# Patient Record
Sex: Female | Born: 1960 | Race: White | Hispanic: No | Marital: Married | Smoking: Current some day smoker
Health system: Southern US, Community
[De-identification: ages and names within clinical notes are randomized; demographics above are authoritative.]

## PROBLEM LIST (undated history)

## (undated) HISTORY — PX: BACK SURGERY: SHX140

## (undated) HISTORY — PX: PARTIAL HYSTERECTOMY: SHX80

---

## 2004-05-21 ENCOUNTER — Observation Stay (HOSPITAL_COMMUNITY): Admission: RE | Admit: 2004-05-21 | Discharge: 2004-05-22 | Payer: Self-pay | Admitting: *Deleted

## 2006-05-25 ENCOUNTER — Ambulatory Visit: Payer: Self-pay | Admitting: Internal Medicine

## 2014-07-20 ENCOUNTER — Emergency Department (HOSPITAL_BASED_OUTPATIENT_CLINIC_OR_DEPARTMENT_OTHER)
Admission: EM | Admit: 2014-07-20 | Discharge: 2014-07-20 | Disposition: A | Discharge status: Home / Self Care | Payer: 59 | Attending: Emergency Medicine | Admitting: Emergency Medicine

## 2014-07-20 ENCOUNTER — Encounter (HOSPITAL_BASED_OUTPATIENT_CLINIC_OR_DEPARTMENT_OTHER): Payer: Self-pay | Admitting: *Deleted

## 2014-07-20 DIAGNOSIS — S61312A Laceration without foreign body of right middle finger with damage to nail, initial encounter: Secondary | ICD-10-CM

## 2014-07-20 DIAGNOSIS — S61212A Laceration without foreign body of right middle finger without damage to nail, initial encounter: Secondary | ICD-10-CM | POA: Insufficient documentation

## 2014-07-20 DIAGNOSIS — Z23 Encounter for immunization: Secondary | ICD-10-CM | POA: Diagnosis not present

## 2014-07-20 DIAGNOSIS — Y9289 Other specified places as the place of occurrence of the external cause: Secondary | ICD-10-CM | POA: Insufficient documentation

## 2014-07-20 DIAGNOSIS — S6991XA Unspecified injury of right wrist, hand and finger(s), initial encounter: Secondary | ICD-10-CM | POA: Diagnosis present

## 2014-07-20 DIAGNOSIS — Z720 Tobacco use: Secondary | ICD-10-CM | POA: Insufficient documentation

## 2014-07-20 DIAGNOSIS — Y9389 Activity, other specified: Secondary | ICD-10-CM | POA: Diagnosis not present

## 2014-07-20 DIAGNOSIS — Y998 Other external cause status: Secondary | ICD-10-CM | POA: Insufficient documentation

## 2014-07-20 DIAGNOSIS — W290XXA Contact with powered kitchen appliance, initial encounter: Secondary | ICD-10-CM | POA: Diagnosis not present

## 2014-07-20 MED ORDER — TETANUS-DIPHTH-ACELL PERTUSSIS 5-2.5-18.5 LF-MCG/0.5 IM SUSP
0.5000 mL | Freq: Once | INTRAMUSCULAR | Status: AC
  Administered 2014-07-20: 0.5 mL via INTRAMUSCULAR
  Filled 2014-07-20: qty 0.5

## 2014-07-20 MED ORDER — LIDOCAINE HCL (PF) 1 % IJ SOLN
10.0000 mL | Freq: Once | INTRAMUSCULAR | Status: AC
  Administered 2014-07-20: 10 mL
  Filled 2014-07-20: qty 10

## 2014-07-20 NOTE — ED Notes (Signed)
Pt states that approx one hour ago she was getting something out of her cabinet and a blade from the food processor fell and she attempted to catch it causing a laceration to her middle finger. Bleeding controlled. Last Td unknown. approx one inch laceration noted to the base of her middle finger.

## 2014-07-20 NOTE — ED Provider Notes (Signed)
CSN: [id number]     Arrival date & time 07/20/14  2022 History   First MD Initiated Contact with Patient 07/20/14 2036     Chief Complaint  Patient presents with  . Finger Injury     (Consider location/radiation/quality/duration/timing/severity/associated sxs/prior Treatment) HPI Comments: 54 year old female presenting with a laceration to her right middle finger occurring 1 hour PTA when she was trying to catch the food processor blade that fell out of the cabinet. No aggravating or alleviating factors. She was able to control the bleeding with pressure. Denies any significant pain. States her finger is slightly numb. Unknown last tetanus.  Patient is a 54 y.o. female presenting with skin laceration. The history is provided by the patient.  Laceration Location:  Finger Finger laceration location:  R middle finger Length (cm):  2 Depth:  Through underlying tissue Bleeding: controlled   Time since incident:  1 hour Injury mechanism: food processor blade. Foreign body present:  No foreign bodies Relieved by:  None tried Tetanus status:  Unknown   History reviewed. No pertinent past medical history. Past Surgical History  Procedure Laterality Date  . Back surgery    . Partial hysterectomy     No family history on file. History  Substance Use Topics  . Smoking status: Current Some Day Smoker  . Smokeless tobacco: Not on file  . Alcohol Use: No   OB History    No data available     Review of Systems  Constitutional: Negative.   Musculoskeletal: Negative for joint swelling.  Skin: Positive for wound.  Neurological: Positive for numbness.      Allergies  Codeine  Home Medications   Prior to Admission medications   Not on File   BP 125/68 mmHg  Pulse 86  Temp(Src) 98.3 F (36.8 C)  Resp 18  Ht  (1.626 m)  Wt 142 lb (64.411 kg)  BMI 24.36 kg/m2  SpO2 99% Physical Exam  Constitutional: She is oriented to person, place, and time. She appears  well-developed and well-nourished. No distress.  HENT:  Head: Normocephalic and atraumatic.  Mouth/Throat: Oropharynx is clear and moist.  Eyes: Conjunctivae and EOM are normal.  Neck: Normal range of motion. Neck supple.  Cardiovascular: Normal rate, regular rhythm and normal heart sounds.   Pulmonary/Chest: Effort normal and breath sounds normal. No respiratory distress.  Musculoskeletal: Normal range of motion. She exhibits no edema.       Hands: 2 cm laceration to palmar aspect of proximal phalanx of right middle finger. No visible bone or tendon. Laceration through subcutaneous tissue. No active bleeding. Full flexion and extension at MCP, PIP and DIP. Cap refill less than 2 seconds. Sensation intact.  Neurological: She is alert and oriented to person, place, and time. No sensory deficit.  Skin: Skin is warm and dry.  Psychiatric: She has a normal mood and affect. Her behavior is normal.  Nursing note and vitals reviewed.   ED Course  Procedures (including critical care time) LACERATION REPAIR Performed by: Celene Skeen Authorized by: Celene Skeen Consent: Verbal consent obtained. Risks and benefits: risks, benefits and alternatives were discussed Consent given by: patient Patient identity confirmed: provided demographic data Prepped and Draped in normal sterile fashion Wound explored  Laceration Location: right middle finger  Laceration Length: 2 cm  No Foreign Bodies seen or palpated  Anesthesia: local infiltration  Local anesthetic: lidocaine 1% without epinephrine  Anesthetic total: 2 ml  Irrigation method: syringe Amount of cleaning: standard  Skin closure: 5-0  prolene  Number of sutures: simple interrupted  Technique: 4  Patient tolerance: Patient tolerated the procedure well with no immediate complications.  Labs Review Labs Reviewed - No data to display  Imaging Review No results found.   EKG Interpretation None      MDM   Final diagnoses:    Laceration of right middle finger w/o foreign body with damage to nail, initial encounter   Neurovascularly intact. No evidence of tendon disruption. Wound care given. Tetanus updated. Laceration repaired. Stable for discharge. Return precautions given. Patient states understanding of treatment care plan and is agreeable.  Kathrynn SpeedRobyn M Dave Mergen, PA-C 07/20/14 2117  Gilda Creasehristopher J Pollina, MD 07/21/14 [id number]

## 2014-07-20 NOTE — Discharge Instructions (Signed)
Laceration Care, Adult °A laceration is a cut or lesion that goes through all layers of the skin and into the tissue just beneath the skin. °TREATMENT  °Some lacerations may not require closure. Some lacerations may not be able to be closed due to an increased risk of infection. It is important to see your caregiver as soon as possible after an injury to minimize the risk of infection and maximize the opportunity for successful closure. °If closure is appropriate, pain medicines may be given, if needed. The wound will be cleaned to help prevent infection. Your caregiver will use stitches (sutures), staples, wound glue (adhesive), or skin adhesive strips to repair the laceration. These tools bring the skin edges together to allow for faster healing and a better cosmetic outcome. However, all wounds will heal with a scar. Once the wound has healed, scarring can be minimized by covering the wound with sunscreen during the day for 1 full year. °HOME CARE INSTRUCTIONS  °For sutures or staples: °· Keep the wound clean and dry. °· If you were given a bandage (dressing), you should change it at least once a day. Also, change the dressing if it becomes wet or dirty, or as directed by your caregiver. °· Wash the wound with soap and water 2 times a day. Rinse the wound off with water to remove all soap. Pat the wound dry with a clean towel. °· After cleaning, apply a thin layer of the antibiotic ointment as recommended by your caregiver. This will help prevent infection and keep the dressing from sticking. °· You may shower as usual after the first 24 hours. Do not soak the wound in water until the sutures are removed. °· Only take over-the-counter or prescription medicines for pain, discomfort, or fever as directed by your caregiver. °· Get your sutures or staples removed as directed by your caregiver. °For skin adhesive strips: °· Keep the wound clean and dry. °· Do not get the skin adhesive strips wet. You may bathe  carefully, using caution to keep the wound dry. °· If the wound gets wet, pat it dry with a clean towel. °· Skin adhesive strips will fall off on their own. You may trim the strips as the wound heals. Do not remove skin adhesive strips that are still stuck to the wound. They will fall off in time. °For wound adhesive: °· You may briefly wet your wound in the shower or bath. Do not soak or scrub the wound. Do not swim. Avoid periods of heavy perspiration until the skin adhesive has fallen off on its own. After showering or bathing, gently pat the wound dry with a clean towel. °· Do not apply liquid medicine, cream medicine, or ointment medicine to your wound while the skin adhesive is in place. This may loosen the film before your wound is healed. °· If a dressing is placed over the wound, be careful not to apply tape directly over the skin adhesive. This may cause the adhesive to be pulled off before the wound is healed. °· Avoid prolonged exposure to sunlight or tanning lamps while the skin adhesive is in place. Exposure to ultraviolet light in the first year will darken the scar. °· The skin adhesive will usually remain in place for 5 to 10 days, then naturally fall off the skin. Do not pick at the adhesive film. °You may need a tetanus shot if: °· You cannot remember when you had your last tetanus shot. °· You have never had a tetanus   shot. °If you get a tetanus shot, your arm may swell, get red, and feel warm to the touch. This is common and not a problem. If you need a tetanus shot and you choose not to have one, there is a rare chance of getting tetanus. Sickness from tetanus can be serious. °SEEK MEDICAL CARE IF:  °· You have redness, swelling, or increasing pain in the wound. °· You see a red line that goes away from the wound. °· You have yellowish-white fluid (pus) coming from the wound. °· You have a fever. °· You notice a bad smell coming from the wound or dressing. °· Your wound breaks open before or  after sutures have been removed. °· You notice something coming out of the wound such as wood or glass. °· Your wound is on your hand or foot and you cannot move a finger or toe. °SEEK IMMEDIATE MEDICAL CARE IF:  °· Your pain is not controlled with prescribed medicine. °· You have severe swelling around the wound causing pain and numbness or a change in color in your arm, hand, leg, or foot. °· Your wound splits open and starts bleeding. °· You have worsening numbness, weakness, or loss of function of any joint around or beyond the wound. °· You develop painful lumps near the wound or on the skin anywhere on your body. °MAKE SURE YOU:  °· Understand these instructions. °· Will watch your condition. °· Will get help right away if you are not doing well or get worse. °Document Released: 02/08/2005 Document Revised: 05/03/2011 Document Reviewed: 08/04/2010 °[employer]® Patient Information ©2015 [employer]. This information is not intended to replace advice given to you by your health care provider. Make sure you discuss any questions you have with your health care provider. ° °Sutured Wound Care °Sutures are stitches that can be used to close wounds. Wound care helps prevent pain and infection.  °HOME CARE INSTRUCTIONS  °· Rest and elevate the injured area until all the pain and swelling are gone. °· Only take over-the-counter or prescription medicines for pain, discomfort, or fever as directed by your caregiver. °· After 48 hours, gently wash the area with mild soap and water once a day, or as directed. Rinse off the soap. Pat the area dry with a clean towel. Do not rub the wound. This may cause bleeding. °· Follow your caregiver's instructions for how often to change the bandage (dressing). Stop using a dressing after 2 days or after the wound stops draining. °· If the dressing sticks, moisten it with soapy water and gently remove it. °· Apply ointment on the wound as directed. °· Avoid stretching a sutured  wound. °· Drink enough fluids to keep your urine clear or pale yellow. °· Follow up with your caregiver for suture removal as directed. °· Use sunscreen on your wound for the next 3 to 6 months so the scar will not darken. °SEEK IMMEDIATE MEDICAL CARE IF:  °· Your wound becomes red, swollen, hot, or tender. °· You have increasing pain in the wound. °· You have a red streak that extends from the wound. °· There is pus coming from the wound. °· You have a fever. °· You have shaking chills. °· There is a bad smell coming from the wound. °· You have persistent bleeding from the wound. °MAKE SURE YOU:  °· Understand these instructions. °· Will watch your condition. °· Will get help right away if you are not doing well or get worse. °Document Released:   03/18/2004 Document Revised: 05/03/2011 Document Reviewed: 06/14/2010 °[employer]® Patient Information ©2015 [employer]. This information is not intended to replace advice given to you by your health care provider. Make sure you discuss any questions you have with your health care provider. ° °

## 2018-10-31 ENCOUNTER — Emergency Department (HOSPITAL_COMMUNITY): Payer: BLUE CROSS/BLUE SHIELD

## 2018-10-31 ENCOUNTER — Other Ambulatory Visit: Payer: Self-pay

## 2018-10-31 ENCOUNTER — Inpatient Hospital Stay (HOSPITAL_COMMUNITY)
Admission: EM | Admit: 2018-10-31 | Discharge: 2018-11-03 | DRG: 064 | Disposition: A | Discharge status: Home / Self Care | Payer: BLUE CROSS/BLUE SHIELD | Attending: Neurosurgery | Admitting: Neurosurgery

## 2018-10-31 DIAGNOSIS — Z885 Allergy status to narcotic agent status: Secondary | ICD-10-CM

## 2018-10-31 DIAGNOSIS — Z90711 Acquired absence of uterus with remaining cervical stump: Secondary | ICD-10-CM | POA: Diagnosis not present

## 2018-10-31 DIAGNOSIS — Z23 Encounter for immunization: Secondary | ICD-10-CM

## 2018-10-31 DIAGNOSIS — Z20828 Contact with and (suspected) exposure to other viral communicable diseases: Secondary | ICD-10-CM | POA: Diagnosis present

## 2018-10-31 DIAGNOSIS — F17210 Nicotine dependence, cigarettes, uncomplicated: Secondary | ICD-10-CM | POA: Diagnosis present

## 2018-10-31 DIAGNOSIS — I614 Nontraumatic intracerebral hemorrhage in cerebellum: Principal | ICD-10-CM

## 2018-10-31 DIAGNOSIS — R739 Hyperglycemia, unspecified: Secondary | ICD-10-CM | POA: Diagnosis present

## 2018-10-31 DIAGNOSIS — I609 Nontraumatic subarachnoid hemorrhage, unspecified: Secondary | ICD-10-CM

## 2018-10-31 DIAGNOSIS — Z91040 Latex allergy status: Secondary | ICD-10-CM | POA: Diagnosis not present

## 2018-10-31 DIAGNOSIS — Q282 Arteriovenous malformation of cerebral vessels: Secondary | ICD-10-CM

## 2018-10-31 DIAGNOSIS — R531 Weakness: Secondary | ICD-10-CM | POA: Diagnosis present

## 2018-10-31 DIAGNOSIS — I619 Nontraumatic intracerebral hemorrhage, unspecified: Secondary | ICD-10-CM | POA: Diagnosis present

## 2018-10-31 LAB — SARS CORONAVIRUS 2 BY RT PCR (HOSPITAL ORDER, PERFORMED IN ~~LOC~~ HOSPITAL LAB): SARS Coronavirus 2: NEGATIVE

## 2018-10-31 LAB — I-STAT BETA HCG BLOOD, ED (MC, WL, AP ONLY): I-stat hCG, quantitative: <5 m[IU]/mL (ref <5)

## 2018-10-31 LAB — COMPREHENSIVE METABOLIC PANEL
ALT: 18 U/L (ref 0–44)
AST: 15 U/L (ref 15–41)
Albumin: 3.8 g/dL (ref 3.5–5.0)
Alkaline Phosphatase: 59 U/L (ref 38–126)
Anion gap: 11 (ref 5–15)
BUN: 19 mg/dL (ref 6–20)
CO2: 20 mmol/L — ABNORMAL LOW (ref 22–32)
Calcium: 9.2 mg/dL (ref 8.9–10.3)
Chloride: 108 mmol/L (ref 98–111)
Creatinine, Ser: 0.92 mg/dL (ref 0.44–1.00)
GFR calc Af Amer: >60 mL/min (ref >60)
GFR calc non Af Amer: >60 mL/min (ref >60)
Glucose, Bld: 165 mg/dL — ABNORMAL HIGH (ref 70–99)
Potassium: 3.9 mmol/L (ref 3.5–5.1)
Sodium: 139 mmol/L (ref 135–145)
Total Bilirubin: 0.3 mg/dL (ref 0.3–1.2)
Total Protein: 6.9 g/dL (ref 6.5–8.1)

## 2018-10-31 LAB — CBC
HCT: 41.8 % (ref 36.0–46.0)
Hemoglobin: 13.8 g/dL (ref 12.0–15.0)
MCH: 27.9 pg (ref 26.0–34.0)
MCHC: 33 g/dL (ref 30.0–36.0)
MCV: 84.6 fL (ref 80.0–100.0)
Platelets: 257 10*3/uL (ref 150–400)
RBC: 4.94 MIL/uL (ref 3.87–5.11)
RDW: 13.2 % (ref 11.5–15.5)
WBC: 9.4 10*3/uL (ref 4.0–10.5)
nRBC: 0 % (ref 0.0–0.2)

## 2018-10-31 LAB — I-STAT CHEM 8, ED
BUN: 21 mg/dL — ABNORMAL HIGH (ref 6–20)
Calcium, Ion: 1.18 mmol/L (ref 1.15–1.40)
Chloride: 107 mmol/L (ref 98–111)
Creatinine, Ser: 0.8 mg/dL (ref 0.44–1.00)
Glucose, Bld: 160 mg/dL — ABNORMAL HIGH (ref 70–99)
HCT: 43 % (ref 36.0–46.0)
Hemoglobin: 14.6 g/dL (ref 12.0–15.0)
Potassium: 3.8 mmol/L (ref 3.5–5.1)
Sodium: 141 mmol/L (ref 135–145)
TCO2: 19 mmol/L — ABNORMAL LOW (ref 22–32)

## 2018-10-31 LAB — DIFFERENTIAL
Abs Immature Granulocytes: 0.15 10*3/uL — ABNORMAL HIGH (ref 0.00–0.07)
Basophils Absolute: 0 10*3/uL (ref 0.0–0.1)
Basophils Relative: 0 %
Eosinophils Absolute: 0.1 10*3/uL (ref 0.0–0.5)
Eosinophils Relative: 1 %
Immature Granulocytes: 2 %
Lymphocytes Relative: 30 %
Lymphs Abs: 2.8 10*3/uL (ref 0.7–4.0)
Monocytes Absolute: 0.5 10*3/uL (ref 0.1–1.0)
Monocytes Relative: 6 %
Neutro Abs: 5.7 10*3/uL (ref 1.7–7.7)
Neutrophils Relative %: 61 %

## 2018-10-31 LAB — APTT: aPTT: 22 seconds — ABNORMAL LOW (ref 24–36)

## 2018-10-31 LAB — CBG MONITORING, ED: Glucose-Capillary: 148 mg/dL — ABNORMAL HIGH (ref 70–99)

## 2018-10-31 LAB — PROTIME-INR
INR: 1 (ref 0.8–1.2)
Prothrombin Time: 13.4 seconds (ref 11.4–15.2)

## 2018-10-31 MED ORDER — CLEVIDIPINE BUTYRATE 0.5 MG/ML IV EMUL: 0.0000 mg/h | INTRAVENOUS | Status: DC

## 2018-10-31 MED ORDER — SODIUM CHLORIDE 0.9 % IV SOLN
INTRAVENOUS | Status: DC
  Administered 2018-10-31 – 2018-11-01 (×3): via INTRAVENOUS

## 2018-10-31 MED ORDER — PANTOPRAZOLE SODIUM 40 MG IV SOLR
40.0000 mg | Freq: Every day | INTRAVENOUS | Status: DC
  Administered 2018-10-31: 40 mg via INTRAVENOUS
  Filled 2018-10-31: qty 40

## 2018-10-31 MED ORDER — STROKE: EARLY STAGES OF RECOVERY BOOK
Freq: Once | Status: AC
  Administered 2018-11-01
  Filled 2018-10-31: qty 1

## 2018-10-31 MED ORDER — CHLORHEXIDINE GLUCONATE CLOTH 2 % EX PADS
6.0000 | MEDICATED_PAD | Freq: Every day | CUTANEOUS | Status: DC
  Administered 2018-11-03: 10:00:00 6 via TOPICAL

## 2018-10-31 MED ORDER — IOHEXOL 350 MG/ML SOLN
75.0000 mL | Freq: Once | INTRAVENOUS | Status: AC | PRN
  Administered 2018-10-31: 75 mL via INTRAVENOUS

## 2018-10-31 MED ORDER — LABETALOL HCL 5 MG/ML IV SOLN: 20.0000 mg | Freq: Once | INTRAVENOUS | Status: DC

## 2018-10-31 MED ORDER — ACETAMINOPHEN 325 MG PO TABS
650.0000 mg | ORAL_TABLET | ORAL | Status: DC | PRN
  Administered 2018-10-31 – 2018-11-01 (×3): 650 mg via ORAL
  Filled 2018-10-31 (×3): qty 2

## 2018-10-31 MED ORDER — HYDROMORPHONE HCL 1 MG/ML IJ SOLN: 0.5000 mg | INTRAMUSCULAR | Status: DC | PRN

## 2018-10-31 MED ORDER — TRAMADOL HCL 50 MG PO TABS: 50.0000 mg | ORAL_TABLET | Freq: Four times a day (QID) | ORAL | Status: DC | PRN

## 2018-10-31 MED ORDER — SENNOSIDES-DOCUSATE SODIUM 8.6-50 MG PO TABS
1.0000 | ORAL_TABLET | Freq: Two times a day (BID) | ORAL | Status: DC
  Administered 2018-11-01 – 2018-11-03 (×4): 1 via ORAL
  Filled 2018-10-31 (×5): qty 1

## 2018-10-31 MED ORDER — SODIUM CHLORIDE 0.9% FLUSH
3.0000 mL | Freq: Once | INTRAVENOUS | Status: AC
  Administered 2018-10-31: 3 mL via INTRAVENOUS

## 2018-10-31 MED ORDER — ACETAMINOPHEN 650 MG RE SUPP: 650.0000 mg | RECTAL | Status: DC | PRN

## 2018-10-31 MED ORDER — ONDANSETRON HCL 4 MG/2ML IJ SOLN
4.0000 mg | Freq: Four times a day (QID) | INTRAMUSCULAR | Status: AC | PRN
  Administered 2018-10-31 (×2): 4 mg via INTRAVENOUS
  Filled 2018-10-31 (×2): qty 2

## 2018-10-31 MED ORDER — ACETAMINOPHEN 160 MG/5ML PO SOLN: 650.0000 mg | ORAL | Status: DC | PRN

## 2018-10-31 NOTE — ED Notes (Signed)
ED TO INPATIENT HANDOFF REPORT  ED Nurse Name and Phone #:  Joni Reining [phone]  S Name/Age/Gender Jackie Anderson 58 y.o. female Room/Bed: 017C/017C  Code Status   Code Status: Full Code  Home/SNF/Other Home Patient oriented to: self, place, time and situation Is this baseline? Yes   Triage Complete: Triage complete  Chief Complaint code stroke  Triage Note Pt here via EMS as Code stroke, was walking down the stairs at her house today when she had sudden onset L sided headache, left sided weakness and had to sit down. Pt became nauseas. Pt has decreased sensation on the L side. Pt A/O on arrival.    Allergies Allergies  Allergen Reactions  . Codeine Rash  . Latex Rash    Level of Care/Admitting Diagnosis ED Disposition    ED Disposition Condition Comment   Admit  Hospital Area: [facility] [[id number]]  Level of Care: ICU [6]  Covid Evaluation: Confirmed COVID Negative  Diagnosis: ICH (intracerebral hemorrhage) [facility]) [[id number]]  Admitting Physician: Julio Sicks [phone]  Attending Physician: Julio Sicks [phone]  Estimated length of stay: 5 - 7 days  Certification:: I certify this patient will need inpatient services for at least 2 midnights  Bed request comments: 4n  PT Class (Do Not Modify): Inpatient [101]  PT Acc Code (Do Not Modify): Private [1]       B Medical/Surgery History No past medical history on file. Past Surgical History:  Procedure Laterality Date  . BACK SURGERY    . PARTIAL HYSTERECTOMY       A IV Location/Drains/Wounds Patient Lines/Drains/Airways Status   Active Line/Drains/Airways    Name:   Placement date:   Placement time:   Site:   Days:   Peripheral IV 10/31/18 Right Antecubital   10/31/18    1146    Antecubital   less than 1   Peripheral IV 10/31/18 Right Hand   10/31/18    1147    Hand   less than 1          Intake/Output Last 24 hours No intake or output data in the 24 hours ending 10/31/18  1830  Labs/Imaging Results for orders placed or performed during the hospital encounter of 10/31/18 (from the past 48 hour(s))  CBG monitoring, ED     Status: Abnormal   Collection Time: 10/31/18 11:08 AM  Result Value Ref Range   Glucose-Capillary 148 (H) 70 - 99 mg/dL  Protime-INR     Status: None   Collection Time: 10/31/18 11:10 AM  Result Value Ref Range   Prothrombin Time 13.4 11.4 - 15.2 seconds   INR 1.0 0.8 - 1.2    Comment: (NOTE) INR goal varies based on device and disease states. Performed at [facility], [address], [place], [place] [address]   APTT     Status: Abnormal   Collection Time: 10/31/18 11:10 AM  Result Value Ref Range   aPTT 22 (L) 24 - 36 seconds    Comment: Performed at [facility], [address], [place], [place] [address]  CBC     Status: None   Collection Time: 10/31/18 11:10 AM  Result Value Ref Range   WBC 9.4 4.0 - 10.5 K/uL   RBC 4.94 3.87 - 5.11 MIL/uL   Hemoglobin 13.8 12.0 - 15.0 g/dL   HCT 78.4 69.6 - 29.5 %   MCV 84.6 80.0 - 100.0 fL   MCH 27.9 26.0 - 34.0 pg   MCHC 33.0  30.0 - 36.0 g/dL   RDW 16.1 09.6 - 04.5 %   Platelets 257 150 - 400 K/uL   nRBC 0.0 0.0 - 0.2 %    Comment: Performed at [facility], [address] [place], [place] [address]  Differential     Status: Abnormal   Collection Time: 10/31/18 11:10 AM  Result Value Ref Range   Neutrophils Relative % 61 %   Neutro Abs 5.7 1.7 - 7.7 K/uL   Lymphocytes Relative 30 %   Lymphs Abs 2.8 0.7 - 4.0 K/uL   Monocytes Relative 6 %   Monocytes Absolute 0.5 0.1 - 1.0 K/uL   Eosinophils Relative 1 %   Eosinophils Absolute 0.1 0.0 - 0.5 K/uL   Basophils Relative 0 %   Basophils Absolute 0.0 0.0 - 0.1 K/uL   Immature Granulocytes 2 %   Abs Immature Granulocytes 0.15 (H) 0.00 - 0.07 K/uL    Comment: Performed at [facility], [address], [place], [place] [address]  Comprehensive metabolic panel     Status: Abnormal   Collection Time: 10/31/18  11:10 AM  Result Value Ref Range   Sodium 139 135 - 145 mmol/L   Potassium 3.9 3.5 - 5.1 mmol/L   Chloride 108 98 - 111 mmol/L   CO2 20 (L) 22 - 32 mmol/L   Glucose, Bld 165 (H) 70 - 99 mg/dL   BUN 19 6 - 20 mg/dL   Creatinine, Ser 8.29 0.44 - 1.00 mg/dL   Calcium 9.2 8.9 - 56.2 mg/dL   Total Protein 6.9 6.5 - 8.1 g/dL   Albumin 3.8 3.5 - 5.0 g/dL   AST 15 15 - 41 U/L   ALT 18 0 - 44 U/L   Alkaline Phosphatase 59 38 - 126 U/L   Total Bilirubin 0.3 0.3 - 1.2 mg/dL   GFR calc non Af Amer >60 >60 mL/min   GFR calc Af Amer >60 >60 mL/min   Anion gap 11 5 - 15    Comment: Performed at [facility], [address] [place], [place] [address]  I-stat chem 8, ED     Status: Abnormal   Collection Time: 10/31/18 11:19 AM  Result Value Ref Range   Sodium 141 135 - 145 mmol/L   Potassium 3.8 3.5 - 5.1 mmol/L   Chloride 107 98 - 111 mmol/L   BUN 21 (H) 6 - 20 mg/dL   Creatinine, Ser 5.78 0.44 - 1.00 mg/dL   Glucose, Bld 469 (H) 70 - 99 mg/dL   Calcium, Ion 6.29 5.28 - 1.40 mmol/L   TCO2 19 (L) 22 - 32 mmol/L   Hemoglobin 14.6 12.0 - 15.0 g/dL   HCT 41.3 24.4 - 01.0 %  I-Stat beta hCG blood, ED     Status: None   Collection Time: 10/31/18 11:19 AM  Result Value Ref Range   I-stat hCG, quantitative <5.0 <5 mIU/mL   Comment 3            Comment:   GEST. AGE      CONC.  (mIU/mL)   <=1 WEEK        5 - 50     2 WEEKS       50 - 500     3 WEEKS       100 - 10,000     4 WEEKS     1,000 - 30,000        FEMALE AND NON-PREGNANT FEMALE:  LESS THAN 5 mIU/mL   SARS Coronavirus 2 [facility] order, Performed in [facility] hospital lab) Nasopharyngeal Nasopharyngeal Swab     Status: None   Collection Time: 10/31/18 11:57 AM   Specimen: Nasopharyngeal Swab  Result Value Ref Range   SARS Coronavirus 2 NEGATIVE NEGATIVE    Comment: (NOTE) If result is NEGATIVE SARS-CoV-2 target nucleic acids are NOT DETECTED. The SARS-CoV-2 RNA is generally detectable in upper and lower  respiratory  specimens during the acute phase of infection. The lowest  concentration of SARS-CoV-2 viral copies this assay can detect is 250  copies / mL. A negative result does not preclude SARS-CoV-2 infection  and should not be used as the sole basis for treatment or other  patient management decisions.  A negative result may occur with  improper specimen collection / handling, submission of specimen other  than nasopharyngeal swab, presence of viral mutation(s) within the  areas targeted by this assay, and inadequate number of viral copies  (<250 copies / mL). A negative result must be combined with clinical  observations, patient history, and epidemiological information. If result is POSITIVE SARS-CoV-2 target nucleic acids are DETECTED. The SARS-CoV-2 RNA is generally detectable in upper and lower  respiratory specimens dur ing the acute phase of infection.  Positive  results are indicative of active infection with SARS-CoV-2.  Clinical  correlation with patient history and other diagnostic information is  necessary to determine patient infection status.  Positive results do  not rule out bacterial infection or co-infection with other viruses. If result is PRESUMPTIVE POSTIVE SARS-CoV-2 nucleic acids MAY BE PRESENT.   A presumptive positive result was obtained on the submitted specimen  and confirmed on repeat testing.  While 2019 novel coronavirus  (SARS-CoV-2) nucleic acids may be present in the submitted sample  additional confirmatory testing may be necessary for epidemiological  and / or clinical management purposes  to differentiate between  SARS-CoV-2 and other Sarbecovirus currently known to infect humans.  If clinically indicated additional testing with an alternate test  methodology [id number]) is advised. The SARS-CoV-2 RNA is generally  detectable in upper and lower respiratory sp ecimens during the acute  phase of infection. The expected result is Negative. Fact Sheet for  Patients:  BoilerBrush.com.cy Fact Sheet for Healthcare Providers: https://pope.com/ This test is not yet approved or cleared by the [place] FDA and has been authorized for detection and/or diagnosis of SARS-CoV-2 by FDA under an Emergency Use Authorization (EUA).  This EUA will remain in effect (meaning this test can be used) for the duration of the COVID-19 declaration under Section 564(b)(1) of the Act, 21 U.S.C. section 360bbb-3(b)(1), unless the authorization is terminated or revoked sooner. Performed at [facility], [address] [place], [place] [address]    Ct Angio Head W Or Wo Contrast  Result Date: 10/31/2018 CLINICAL DATA:  Intracranial hemorrhage.  Headache EXAM: CT ANGIOGRAPHY HEAD AND NECK TECHNIQUE: Multidetector CT imaging of the head and neck was performed using the standard protocol during bolus administration of intravenous contrast. Multiplanar CT image reconstructions and MIPs were obtained to evaluate the vascular anatomy. Carotid stenosis measurements (when applicable) are obtained utilizing NASCET criteria, using the distal internal carotid diameter as the denominator. CONTRAST:  6mL OMNIPAQUE IOHEXOL 350 MG/ML SOLN COMPARISON:  CT head 10/31/2018 FINDINGS: CTA NECK FINDINGS Aortic arch: Standard branching. Imaged portion shows no evidence of aneurysm or dissection. No significant stenosis of the major arch vessel origins. Right carotid system: Normal right carotid.  Negative for stenosis or dissection. Left carotid system: Normal left carotid. Negative for stenosis or dissection Vertebral arteries: Both vertebral arteries are normal throughout their course and patent to the basilar. Skeleton: Negative Other neck: Goiter with enlargement of the thyroid. Heterogeneous enhancement. No dominant nodule. Small nodules are present bilaterally. Upper chest: Mild apically emphysema.  No acute abnormality. Review of the MIP  images confirms the above findings CTA HEAD FINDINGS Anterior circulation: Cavernous carotid widely patent bilaterally without stenosis. Anterior and middle cerebral arteries patent bilaterally without significant stenosis. Posterior circulation: Both vertebral arteries patent to the basilar. PICA patent bilaterally. Basilar widely patent. Right AICA patent. Superior cerebellar and posterior cerebral arteries patent bilaterally without stenosis Abnormal vasculature in the left superior cerebellum associated with acute hematoma. Findings compatible with AVM. This is supplied by the left superior cerebellar artery with drainage into the straight sinus. The nidus measures approximately 10 mm in diameter Venous sinuses: Patent venous sinuses. Hypoplastic right transverse sinus Anatomic variants: None Review of the MIP images confirms the above findings IMPRESSION: 1. 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 Nidus approximately 1 cm. AVM supply primarily via the left superior cerebellar artery with drainage to the straight sinus. 2. No significant intracranial stenosis 3. No significant carotid or vertebral artery stenosis in the neck. 4. These results were called by telephone at the time of interpretation on 10/31/2018 at 11:30 am to provider [facility] , who verbally acknowledged these results. Electronically Signed   By: Franchot Gallo M.D.   On: 10/31/2018 11:42   Ct Angio Neck W Or Wo Contrast  Result Date: 10/31/2018 CLINICAL DATA:  Intracranial hemorrhage.  Headache EXAM: CT ANGIOGRAPHY HEAD AND NECK TECHNIQUE: Multidetector CT imaging of the head and neck was performed using the standard protocol during bolus administration of intravenous contrast. Multiplanar CT image reconstructions and MIPs were obtained to evaluate the vascular anatomy. Carotid stenosis measurements (when applicable) are obtained utilizing NASCET criteria, using the distal internal carotid diameter as  the denominator. CONTRAST:  7mL OMNIPAQUE IOHEXOL 350 MG/ML SOLN COMPARISON:  CT head 10/31/2018 FINDINGS: CTA NECK FINDINGS Aortic arch: Standard branching. Imaged portion shows no evidence of aneurysm or dissection. No significant stenosis of the major arch vessel origins. Right carotid system: Normal right carotid. Negative for stenosis or dissection. Left carotid system: Normal left carotid. Negative for stenosis or dissection Vertebral arteries: Both vertebral arteries are normal throughout their course and patent to the basilar. Skeleton: Negative Other neck: Goiter with enlargement of the thyroid. Heterogeneous enhancement. No dominant nodule. Small nodules are present bilaterally. Upper chest: Mild apically emphysema.  No acute abnormality. Review of the MIP images confirms the above findings CTA HEAD FINDINGS Anterior circulation: Cavernous carotid widely patent bilaterally without stenosis. Anterior and middle cerebral arteries patent bilaterally without significant stenosis. Posterior circulation: Both vertebral arteries patent to the basilar. PICA patent bilaterally. Basilar widely patent. Right AICA patent. Superior cerebellar and posterior cerebral arteries patent bilaterally without stenosis Abnormal vasculature in the left superior cerebellum associated with acute hematoma. Findings compatible with AVM. This is supplied by the left superior cerebellar artery with drainage into the straight sinus. The nidus measures approximately 10 mm in diameter Venous sinuses: Patent venous sinuses. Hypoplastic right transverse sinus Anatomic variants: None Review of the MIP images confirms the above findings IMPRESSION: 1. 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 Nidus approximately 1 cm. AVM supply primarily via the left superior cerebellar artery with  drainage to the straight sinus. 2. No significant intracranial stenosis 3. No significant carotid or vertebral artery  stenosis in the neck. 4. These results were called by telephone at the time of interpretation on 10/31/2018 at 11:30 am to [facility] , who verbally acknowledged these results. Electronically Signed   By: Marlan Palauharles  Clark M.D.   On: 10/31/2018 11:42   Ct Head Code Stroke Wo Contrast  Result Date: 10/31/2018 CLINICAL DATA:  Code stroke.  Facial weakness.  Left-sided headache. EXAM: CT HEAD WITHOUT CONTRAST TECHNIQUE: Contiguous axial images were obtained from the base of the skull through the vertex without intravenous contrast. COMPARISON:  None. FINDINGS: Brain: Acute hemorrhage in the left superior cerebellum which is diffusely high density. Hematoma measures 1.7 x 1.4 x 2.0 cm. Hematoma volume 2.4 mL blood. No other acute hemorrhage. No evidence of underlying mass or edema Ventricle size normal.  No acute infarct. Vascular: Negative for hyperdense vessel Skull: Negative Sinuses/Orbits: Mild mucosal edema paranasal sinuses. Negative orbit. Other: None ASPECTS (Alberta Stroke Program Early CT Score) - Ganglionic level infarction (caudate, lentiform nuclei, internal capsule, insula, M1-M3 cortex): 7 - Supraganglionic infarction (M4-M6 cortex): 3 Total score (0-10 with 10 being normal): 10 IMPRESSION: 1. Acute hemorrhage left superior cerebellum. Hematoma volume 2.4 mL. Location is unusual for hypertension. Rule out vascular malformation. CTA recommended. 2. ASPECTS is 10 3. These results were called by telephone at the time of interpretation on 10/31/2018 at 11:29 am to Dr. Wilford CornerArora , who verbally acknowledged these results. Electronically Signed   By: Marlan Palauharles  Clark M.D.   On: 10/31/2018 11:30    Pending Labs Unresulted Labs (From admission, onward)    Start     Ordered   10/31/18 1255  HIV antibody (Routine Testing)  Once,   STAT     10/31/18 1258          Vitals/Pain Today's Vitals   10/31/18 1645 10/31/18 1700 10/31/18 1715 10/31/18 1730  BP: 102/62 (!) 112/53 109/64 (!) 111/55  Pulse:  64  63 72  Resp: 17 14 14  (!) 21  Temp:      TempSrc:      SpO2:  96% 96% 97%  Weight:      Height:      PainSc:        Isolation Precautions No active isolations  Medications Medications  ondansetron (ZOFRAN) injection 4 mg (4 mg Intravenous Given 10/31/18 1155)   stroke: mapping our early stages of recovery book (has no administration in time range)  acetaminophen (TYLENOL) tablet 650 mg (650 mg Oral Given 10/31/18 1357)    Or  acetaminophen (TYLENOL) solution 650 mg ( Per Tube See Alternative 10/31/18 1357)    Or  acetaminophen (TYLENOL) suppository 650 mg ( Rectal See Alternative 10/31/18 1357)  senna-docusate (Senokot-S) tablet 1 tablet (1 tablet Oral Not Given 10/31/18 1358)  pantoprazole (PROTONIX) injection 40 mg (has no administration in time range)  0.9 %  sodium chloride infusion ( Intravenous New Bag/Given 10/31/18 1403)  traMADol (ULTRAM) tablet 50-100 mg (has no administration in time range)  HYDROmorphone (DILAUDID) injection 0.5 mg (has no administration in time range)  labetalol (NORMODYNE) injection 20 mg (0 mg Intravenous Hold 10/31/18 1342)    And  clevidipine (CLEVIPREX) infusion 0.5 mg/mL (0 mg/hr Intravenous Hold 10/31/18 1341)  sodium chloride flush (NS) 0.9 % injection 3 mL (3 mLs Intravenous Given 10/31/18 1156)  iohexol (OMNIPAQUE) 350 MG/ML injection 75 mL (75 mLs Intravenous Contrast Given 10/31/18 1130)  Mobility walks Moderate fall risk   Focused Assessments Neuro Assessment Handoff:  Swallow screen pass? Yes  Cardiac Rhythm: Normal sinus rhythm NIH Stroke Scale ( + Modified Stroke Scale Criteria)  Interval: Initial Level of Consciousness (1a.)   : Alert, keenly responsive LOC Questions (1b. )   +: Answers both questions correctly LOC Commands (1c. )   + : Performs both tasks correctly Best Gaze (2. )  +: Normal Visual (3. )  +: No visual loss Facial Palsy (4. )    : Normal symmetrical movements Motor Arm, Left (5a. )   +: No drift Motor Arm, Right (5b. )    +: No drift Motor Leg, Left (6a. )   +: No drift Motor Leg, Right (6b. )   +: No drift Limb Ataxia (7. ): Present in one limb Sensory (8. )   +: Mild-to-moderate sensory loss, patient feels pinprick is less sharp or is dull on the affected side, or there is a loss of superficial pain with pinprick, but patient is aware of being touched Best Language (9. )   +: No aphasia Dysarthria (10. ): Mild-to-moderate dysarthria, patient slurs at least some words and, at worst, can be understood with some difficulty Extinction/Inattention (11.)   +: No Abnormality Modified SS Total  +: 1 Complete NIHSS TOTAL: 4 Last date known well: 10/31/18 Last time known well: 1000 Neuro Assessment: Exceptions to WDL Neuro Checks:   Initial (10/31/18 1115)  Last Documented NIHSS Modified Score: 1 (10/31/18 1510) Has TPA been given? No If patient is a Neuro Trauma and patient is going to OR before floor call report to 4N Charge nurse: [phone] or [phone]     R Recommendations: See Admitting Provider Note  Report given to:   Additional Notes:

## 2018-10-31 NOTE — ED Provider Notes (Signed)
[facility] EMERGENCY DEPARTMENT Provider Note   CSN: [id number] Arrival date & time: 10/31/18  1106  An emergency department physician performed an initial assessment on this suspected stroke patient at 1106.  History   Chief Complaint Chief Complaint  Patient presents with   Code Stroke    HPI Jackie Anderson is a 58 y.o. female.     Jackie Anderson is a 58 y.o. female who is otherwise healthy, presents to the ED via EMS as a code stroke.  Patient reports that she was walking in her house and developed a sudden onset severe posterior left-sided headache, she then started to feel like her left arm and leg were getting weak and had decreased sensation.  At home as EMS arrived patient became nauseous and had one episode of vomiting, and continued to dry heave with EMS, she was given 1 dose of Zofran.  Nausea is now subsiding.  She denies any associated vision changes.  Continues to complain of a left posterior headache.  Reports that she feels a bit unsteady, and continues to report some weakness and decreased sensation on the left side.  No prior history of stroke or bleed.  She is not on any blood thinners, denies history of high blood pressure and does not take any antihypertensives.  She does smoke but denies other drug use.  Reports she has had a lot of increased stress at home, her husband recently had an MI.  No other aggravating or alleviating factors.     No past medical history on file.  There are no active problems to display for this patient.   Past Surgical History:  Procedure Laterality Date   BACK SURGERY     PARTIAL HYSTERECTOMY       OB History   No obstetric history on file.      Home Medications    Prior to Admission medications   Not on File    Family History No family history on file.  Social History Social History   Tobacco Use   Smoking status: Current Some Day Smoker  Substance Use Topics   Alcohol use: No   Drug use:  Not on file     Allergies   Codeine and Latex   Review of Systems Review of Systems  Constitutional: Negative for chills and fever.  HENT: Negative.   Eyes: Negative for visual disturbance.  Respiratory: Negative for cough and shortness of breath.   Cardiovascular: Negative for chest pain.  Gastrointestinal: Positive for nausea and vomiting. Negative for abdominal pain, constipation and diarrhea.  Genitourinary: Negative for dysuria and frequency.  Musculoskeletal: Negative for arthralgias and myalgias.  Skin: Negative for color change and rash.  Neurological: Positive for dizziness, speech difficulty, weakness, numbness and headaches. Negative for syncope and light-headedness.     Physical Exam Updated Vital Signs BP 135/63 (BP Location: Right Arm)    Pulse 68    Temp (!) 96.6 F (35.9 C) (Temporal)    Resp 14    Ht 5\' 4"  (1.626 m)    Wt 70.8 kg    SpO2 97%    BMI 26.79 kg/m   Physical Exam Vitals signs and nursing note reviewed.  Constitutional:      General: She is not in acute distress.    Appearance: Normal appearance. She is well-developed. She is not diaphoretic.  HENT:     Head: Normocephalic and atraumatic.     Mouth/Throat:     Mouth: Mucous membranes are moist.  Pharynx: Oropharynx is clear.  Eyes:     General:        Right eye: No discharge.        Left eye: No discharge.     Extraocular Movements: Extraocular movements intact.     Conjunctiva/sclera: Conjunctivae normal.     Pupils: Pupils are equal, round, and reactive to light.  Neck:     Musculoskeletal: Neck supple.  Cardiovascular:     Rate and Rhythm: Normal rate and regular rhythm.     Pulses: Normal pulses.     Heart sounds: Normal heart sounds. No murmur. No friction rub. No gallop.   Pulmonary:     Effort: Pulmonary effort is normal. No respiratory distress.     Breath sounds: Normal breath sounds. No wheezing or rales.     Comments: Respirations equal and unlabored, patient able to  speak in full sentences, lungs clear to auscultation bilaterally Abdominal:     General: Bowel sounds are normal. There is no distension.     Palpations: Abdomen is soft. There is no mass.     Tenderness: There is no abdominal tenderness. There is no guarding.     Comments: Abdomen soft, nondistended, nontender to palpation in all quadrants without guarding or peritoneal signs  Musculoskeletal:        General: No deformity.     Right lower leg: No edema.     Left lower leg: No edema.  Skin:    General: Skin is warm and dry.     Capillary Refill: Capillary refill takes less than 2 seconds.  Neurological:     Mental Status: She is alert.     Coordination: Coordination normal.     Comments: Speech is slightly dysarthric, able to follow commands. Cranial nerves III through XII grossly intact. Left upper and lower extremity with 4/5 strength, left lower extremity with some drift.  Right upper and lower extremities with 5/5 strength. Patient reports decreased sensation in the left upper and lower extremity when compared to the right. Patient exhibits some ataxia with finger-to-nose with the left upper extremity.  Normal coordination with alternating movements in lower extremities.  Psychiatric:        Mood and Affect: Mood normal.        Behavior: Behavior normal.      ED Treatments / Results  Labs (all labs ordered are listed, but only abnormal results are displayed) Labs Reviewed  APTT - Abnormal; Notable for the following components:      Result Value   aPTT 22 (*)    All other components within normal limits  DIFFERENTIAL - Abnormal; Notable for the following components:   Abs Immature Granulocytes 0.15 (*)    All other components within normal limits  CBG MONITORING, ED - Abnormal; Notable for the following components:   Glucose-Capillary 148 (*)    All other components within normal limits  I-STAT CHEM 8, ED - Abnormal; Notable for the following components:   BUN 21 (*)     Glucose, Bld 160 (*)    TCO2 19 (*)    All other components within normal limits  SARS CORONAVIRUS 2 (HOSPITAL ORDER, PERFORMED IN [facility])  PROTIME-INR  CBC  COMPREHENSIVE METABOLIC PANEL  CBG MONITORING, ED  I-STAT BETA HCG BLOOD, ED ([facility], [facility], [facility] ONLY)    EKG EKG Interpretation  Date/Time:  Tuesday October 31 2018 11:44:36 EDT Ventricular Rate:  67 PR Interval:    QRS Duration: 97 QT Interval:  408 QTC Calculation: 431 R Axis:   53 Text Interpretation:  Sinus rhythm No prior ECG for comparison.  No STEMI Confirmed by Theda Belfastegeler, Chris ([id number]) on 10/31/2018 1:44:05 PM   Radiology Ct Angio Head W Or Wo Contrast  Result Date: 10/31/2018 CLINICAL DATA:  Intracranial hemorrhage.  Headache EXAM: CT ANGIOGRAPHY HEAD AND NECK TECHNIQUE: Multidetector CT imaging of the head and neck was performed using the standard protocol during bolus administration of intravenous contrast. Multiplanar CT image reconstructions and MIPs were obtained to evaluate the vascular anatomy. Carotid stenosis measurements (when applicable) are obtained utilizing NASCET criteria, using the distal internal carotid diameter as the denominator. CONTRAST:  75mL OMNIPAQUE IOHEXOL 350 MG/ML SOLN COMPARISON:  CT head 10/31/2018 FINDINGS: CTA NECK FINDINGS Aortic arch: Standard branching. Imaged portion shows no evidence of aneurysm or dissection. No significant stenosis of the major arch vessel origins. Right carotid system: Normal right carotid. Negative for stenosis or dissection. Left carotid system: Normal left carotid. Negative for stenosis or dissection Vertebral arteries: Both vertebral arteries are normal throughout their course and patent to the basilar. Skeleton: Negative Other neck: Goiter with enlargement of the thyroid. Heterogeneous enhancement. No dominant nodule. Small nodules are present bilaterally. Upper chest: Mild apically emphysema.  No acute abnormality. Review of the MIP images confirms the  above findings CTA HEAD FINDINGS Anterior circulation: Cavernous carotid widely patent bilaterally without stenosis. Anterior and middle cerebral arteries patent bilaterally without significant stenosis. Posterior circulation: Both vertebral arteries patent to the basilar. PICA patent bilaterally. Basilar widely patent. Right AICA patent. Superior cerebellar and posterior cerebral arteries patent bilaterally without stenosis Abnormal vasculature in the left superior cerebellum associated with acute hematoma. Findings compatible with AVM. This is supplied by the left superior cerebellar artery with drainage into the straight sinus. The nidus measures approximately 10 mm in diameter Venous sinuses: Patent venous sinuses. Hypoplastic right transverse sinus Anatomic variants: None Review of the MIP images confirms the above findings IMPRESSION: 1. 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 Nidus approximately 1 cm. AVM supply primarily via the left superior cerebellar artery with drainage to the straight sinus. 2. No significant intracranial stenosis 3. No significant carotid or vertebral artery stenosis in the neck. 4. These results were called by telephone at the time of interpretation on 10/31/2018 at 11:30 am to provider [facility] , who verbally acknowledged these results. Electronically Signed   By: Marlan Palauharles  Clark M.D.   On: 10/31/2018 11:42   Ct Angio Neck W Or Wo Contrast  Result Date: 10/31/2018 CLINICAL DATA:  Intracranial hemorrhage.  Headache EXAM: CT ANGIOGRAPHY HEAD AND NECK TECHNIQUE: Multidetector CT imaging of the head and neck was performed using the standard protocol during bolus administration of intravenous contrast. Multiplanar CT image reconstructions and MIPs were obtained to evaluate the vascular anatomy. Carotid stenosis measurements (when applicable) are obtained utilizing NASCET criteria, using the distal internal carotid diameter as the denominator.  CONTRAST:  75mL OMNIPAQUE IOHEXOL 350 MG/ML SOLN COMPARISON:  CT head 10/31/2018 FINDINGS: CTA NECK FINDINGS Aortic arch: Standard branching. Imaged portion shows no evidence of aneurysm or dissection. No significant stenosis of the major arch vessel origins. Right carotid system: Normal right carotid. Negative for stenosis or dissection. Left carotid system: Normal left carotid. Negative for stenosis or dissection Vertebral arteries: Both vertebral arteries are normal throughout their course and patent to the basilar. Skeleton: Negative Other neck: Goiter with enlargement of the thyroid. Heterogeneous enhancement. No dominant nodule. Small nodules are present  bilaterally. Upper chest: Mild apically emphysema.  No acute abnormality. Review of the MIP images confirms the above findings CTA HEAD FINDINGS Anterior circulation: Cavernous carotid widely patent bilaterally without stenosis. Anterior and middle cerebral arteries patent bilaterally without significant stenosis. Posterior circulation: Both vertebral arteries patent to the basilar. PICA patent bilaterally. Basilar widely patent. Right AICA patent. Superior cerebellar and posterior cerebral arteries patent bilaterally without stenosis Abnormal vasculature in the left superior cerebellum associated with acute hematoma. Findings compatible with AVM. This is supplied by the left superior cerebellar artery with drainage into the straight sinus. The nidus measures approximately 10 mm in diameter Venous sinuses: Patent venous sinuses. Hypoplastic right transverse sinus Anatomic variants: None Review of the MIP images confirms the above findings IMPRESSION: 1. 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 Nidus approximately 1 cm. AVM supply primarily via the left superior cerebellar artery with drainage to the straight sinus. 2. No significant intracranial stenosis 3. No significant carotid or vertebral artery stenosis in the neck.  4. These results were called by telephone at the time of interpretation on 10/31/2018 at 11:30 am to provider [facility] , who verbally acknowledged these results. Electronically Signed   By: Marlan Palau M.D.   On: 10/31/2018 11:42   Ct Head Code Stroke Wo Contrast  Result Date: 10/31/2018 CLINICAL DATA:  Code stroke.  Facial weakness.  Left-sided headache. EXAM: CT HEAD WITHOUT CONTRAST TECHNIQUE: Contiguous axial images were obtained from the base of the skull through the vertex without intravenous contrast. COMPARISON:  None. FINDINGS: Brain: Acute hemorrhage in the left superior cerebellum which is diffusely high density. Hematoma measures 1.7 x 1.4 x 2.0 cm. Hematoma volume 2.4 mL blood. No other acute hemorrhage. No evidence of underlying mass or edema Ventricle size normal.  No acute infarct. Vascular: Negative for hyperdense vessel Skull: Negative Sinuses/Orbits: Mild mucosal edema paranasal sinuses. Negative orbit. Other: None ASPECTS (Alberta Stroke Program Early CT Score) - Ganglionic level infarction (caudate, lentiform nuclei, internal capsule, insula, M1-M3 cortex): 7 - Supraganglionic infarction (M4-M6 cortex): 3 Total score (0-10 with 10 being normal): 10 IMPRESSION: 1. Acute hemorrhage left superior cerebellum. Hematoma volume 2.4 mL. Location is unusual for hypertension. Rule out vascular malformation. CTA recommended. 2. ASPECTS is 10 3. These results were called by telephone at the time of interpretation on 10/31/2018 at 11:29 am to Dr. Wilford Corner , who verbally acknowledged these results. Electronically Signed   By: Marlan Palau M.D.   On: 10/31/2018 11:30    Procedures .Critical Care Performed by: Dartha Lodge, PA-C Authorized by: Dartha Lodge, PA-C   Critical care provider statement:    Critical care time (minutes):  45   Critical care was necessary to treat or prevent imminent or life-threatening deterioration of the following conditions:  CNS failure or compromise (Cerebellar  hemorrhage from AVM)   Critical care was time spent personally by me on the following activities:  Discussions with consultants, evaluation of patient's response to treatment, examination of patient, ordering and performing treatments and interventions, ordering and review of laboratory studies, ordering and review of radiographic studies, pulse oximetry, re-evaluation of patient's condition, obtaining history from patient or surrogate and review of old charts   (including critical care time)  Medications Ordered in ED Medications  sodium chloride flush (NS) 0.9 % injection 3 mL (has no administration in time range)  ondansetron (ZOFRAN) injection 4 mg (has no administration in time range)  iohexol (OMNIPAQUE) 350 MG/ML injection 75  mL (75 mLs Intravenous Contrast Given 10/31/18 1130)     Initial Impression / Assessment and Plan / ED Course  I have reviewed the triage vital signs and the nursing notes.  Pertinent labs & imaging results that were available during my care of the patient were reviewed by me and considered in my medical decision making (see chart for details).  58 year old female presents as code stroke, assessed immediately upon arrival with neurology, Dr. Rory Percy.  Had sudden onset severe left-sided headache with associated left-sided weakness and vomiting.  Airway breathing and circulation intact, proceeded immediately to CT, Noncon head CT shows a left cerebellar hemorrhage, relatively small, 2.4 mL, but close to the fourth ventricle.  Thought to be an atypical location for a hypertensive bleed and patient's blood pressures have been normal here in the department, patient does not have history of hypertension and does not take any blood pressure medications.  CTA of the head and neck ordered to assess for possible AVM.  CTA shows an AVM in the left superior cerebellum accounting for the acute hemorrhage in this region, nidus approximately 1 cm.  Case discussed with Dr. Rory Percy who  recommends neurosurgical consultation and admission.  Neurology does not feel that patient needs hypertonic saline or antiepileptics at this time.  Blood pressure remains well controlled without medications.   Lab work overall reassuring, mild hyperglycemia of 165 but no other electrolyte derangements, no leukocytosis and normal hemoglobin.  Negative COVID screening.  Normal coags.  Case discussed with Dr. Trenton Gammon with neurosurgery who will see and admit the patient.  Final Clinical Impressions(s) / ED Diagnoses   Final diagnoses:  Cerebellar hemorrhage, acute ([place])  AVM (arteriovenous malformation) brain    ED Discharge Orders    None       Jacqlyn Larsen, [place] 10/31/18 1351    Tegeler, Gwenyth Allegra, MD 10/31/18 2204

## 2018-10-31 NOTE — ED Notes (Signed)
Husband Kimani Bedoya [phone]  Daughter Loraine Leriche [phone]

## 2018-10-31 NOTE — ED Triage Notes (Signed)
Pt here via EMS as Code stroke, was walking down the stairs at her house today when she had sudden onset L sided headache, left sided weakness and had to sit down. Pt became nauseas. Pt has decreased sensation on the L side. Pt A/O on arrival.

## 2018-10-31 NOTE — Code Documentation (Signed)
58yo female arriving to [facility] via [place] at 30. Patient from home where she reports she went to put something in the laundry and had sudden onset left sided throbbing headache at 1000. Patient with nausea and vomiting. Patient developed left sided weakness and decreased sensation. EMS was called and a code stroke was activated. Stroke team at the bedside on patient arrival. Labs drawn and patient cleared for CT by Dr. Sherry Ruffing. Patient to CT with team. CT completed showing acute hemorrhage left superior cerebellum. CTA completed showing AVM left superior cerebellum. Patient was not hypertensive on arrival. Patient with h/o smoking. NIHSS 4, see documentation for details and code stroke times. Patient with LLE drift, LUE decreased sensation, ataxia and mild dysarthria on exam. NS consult. Bedside handoff with ED RN Judson Roch.

## 2018-10-31 NOTE — Consult Note (Addendum)
STROKE neurology consultation  Consult requested by Dr. Gerald Stabs Tegeler   CC: Headache vomiting left-sided weakness  History is obtained from: Patient, EMS  HPI: Jackie Anderson is a 58 y.o. female no past medical history, has not seen a doctor in many years, presenting to the emergency room via EMS for sudden onset of left-sided headache followed by nausea, vomiting and left-sided weakness. EMS was called to assess her.  On their assessment, she was unable to use her left leg to walk.  They had to assist her into the ambulance. Her symptoms started to improve some on the way but she continued to have some left-sided weakness. She denies any fevers chills.  Denies any shortness of breath coughing.  Denies chest pain palpitations.  Not on any blood thinners.  Not on any medications for that matter. Last seen her doctor many years ago. Smokes 1 pack of cigarettes a day. Denies illicit drug or alcohol abuse.  LKW: 10 AM on 10/31/2018 tpa given?: no, ICH Premorbid modified Rankin scale (mRS):0 ICH Score: 1 for infratentorial   ROS: ROS was performed and is negative except as noted in the HPI.   PMH: none  No past medical history on file.   No family history on file. No f/h of stroke.   Social History:   reports that she has been smoking. She does not have any smokeless tobacco history on file. She reports that she does not drink alcohol. No history on file for drug.  Medications  Current Facility-Administered Medications:  .  sodium chloride flush (NS) 0.9 % injection 3 mL, 3 mL, Intravenous, Once, Tegeler, Gwenyth Allegra, MD No current outpatient medications on file.  Exam: Current vital signs: Wt 70.8 kg   BMI 26.79 kg/m  Vital signs in last 24 hours: Weight:  [70.8 kg] 70.8 kg (09/08 1100) General: Awake alert in no distress HEENT normocephalic atraumatic  Clear to auscultation Cardiovascular: Regular rate rhythm Abdomen: Soft nondistended nontender Extremities warm well  perfused Neurological exam Awake alert oriented x3 Speech is mildly dysarthric. Naming repetition andComprehension repetition is intact Cranial nerves: Pupils equal round react to light, extraocular was intact, visual fields full, face appears symmetric, facial sensation intact, tongue and palate midline. Motor exam: Mild vertical drift in the left lower extremity, mild weakness in the left upper extremity but no vertical drift. Right upper and lower extremity 5/5 with no drift. Sensory exam: Decreased incision on the left arm. Coordination: No gross dysmetria noted NIHSS 4   Labs I have reviewed labs in epic and the results pertinent to this consultation are:  CBC    Component Value Date/Time   WBC 9.4 10/31/2018 1110   RBC 4.94 10/31/2018 1110   HGB 14.6 10/31/2018 1119   HCT 43.0 10/31/2018 1119   PLT 257 10/31/2018 1110   MCV 84.6 10/31/2018 1110   MCH 27.9 10/31/2018 1110   MCHC 33.0 10/31/2018 1110   RDW 13.2 10/31/2018 1110   LYMPHSABS 2.8 10/31/2018 1110   MONOABS 0.5 10/31/2018 1110   EOSABS 0.1 10/31/2018 1110   BASOSABS 0.0 10/31/2018 1110    CMP     Component Value Date/Time   NA 141 10/31/2018 1119   K 3.8 10/31/2018 1119   CL 107 10/31/2018 1119   CO2 20 (L) 10/31/2018 1110   GLUCOSE 160 (H) 10/31/2018 1119   BUN 21 (H) 10/31/2018 1119   CREATININE 0.80 10/31/2018 1119   CALCIUM 9.2 10/31/2018 1110   PROT 6.9 10/31/2018 1110   ALBUMIN  3.8 10/31/2018 1110   AST 15 10/31/2018 1110   ALT 18 10/31/2018 1110   ALKPHOS 59 10/31/2018 1110   BILITOT 0.3 10/31/2018 1110   GFRNONAA >60 10/31/2018 1110   GFRAA >60 10/31/2018 1110    Imaging I have reviewed the images obtained:  CT-scan of the brain with an atypical appearing left medial cerebellar bleed. CTA with a left superior cerebellum AVM.  Approximately 1 cm nidus.  AVM supplied primarily by the left SCA with drainage to the straight sinus.   Assessment:  58 year old man with no significant past  medical history has not been to a doctor in many years presented with sudden onset of left-sided headache, nausea, vomiting and some left-sided weakness with waxing and waning improvement in the left-sided weakness. CT head shows a left-sided cerebellar bleed and a CTA of the head shows a left superior cerebellum AVM supplied by the left SCA with drainage of the straight sinus. Likely etiology of the bleed is the AVM. Patient not hypertensive on arrival or by history.  Impression: Cerebellar AVM, cerebellar ICH.  Recommendations: Blood pressure management-systolic blood pressure goal below 140.  Patient has not required any as needed. Neurosurgical consultation for diagnostic cerebral angiogram and recommendation on embolization/resection of the AVM. No antiplatelets or anticoagulants. Repeat head CT in 6 to 8 hours for stability. There is some local mass-effect from the bleed but no evidence of hydrocephalus.  Do not see a role of using hyperosmotic therapy at this time.  Will defer management to neurosurgery as needed. Relayed my plan to the EDP.  Neurology service will be available as needed.  Please call with questions.  -- Milon DikesAshish Keefer Soulliere, MD [facility] Pager: [phone] If 7pm to 7am, please call on call as listed on AMION.   CRITICAL CARE ATTESTATION Performed by: Milon DikesAshish Ulice Follett, MD Total critical care time: 45 minutes Critical care time was exclusive of separately billable procedures and treating other patients and/or supervising APPs/Residents/Students Critical care was necessary to treat or prevent imminent or life-threatening deterioration due to cerebellar ICH, Cerebellar AVM  This patient is critically ill and at significant risk for neurological worsening and/or death and care requires constant monitoring. Critical care was time spent personally by me on the following activities: development of treatment plan with patient and/or surrogate as well as nursing,  discussions with consultants, evaluation of patient's response to treatment, examination of patient, obtaining history from patient or surrogate, ordering and performing treatments and interventions, ordering and review of laboratory studies, ordering and review of radiographic studies, pulse oximetry, re-evaluation of patient's condition, participation in multidisciplinary rounds and medical decision making of high complexity in the care of this patient.

## 2018-10-31 NOTE — ED Notes (Signed)
Checked patient blood sugar

## 2018-10-31 NOTE — H&P (Signed)
Jackie Anderson is an 58 y.o. female.   Chief Complaint: Headache HPI: 58 year old female who is otherwise in very good health with no major medical issues presents with sudden onset headache and some feelings of left-sided weakness.  Symptoms began suddenly a few hours ago.  No history of trauma.  No history of precipitating event.  Patient denies similar symptoms ever in the past.  Patient reports some headache.  Some feelings of nausea.  She feels some subjective weakness in her left side but this is rapidly cleared.  Currently aside from some mild nausea she denies any other symptoms.  No past medical history on file.  Past Surgical History:  Procedure Laterality Date  . BACK SURGERY    . PARTIAL HYSTERECTOMY      No family history on file. Social History:  reports that she has been smoking. She does not have any smokeless tobacco history on file. She reports that she does not drink alcohol. No history on file for drug.  Allergies:  Allergies  Allergen Reactions  . Codeine   . Latex Rash    (Not in a hospital admission)   Results for orders placed or performed during the hospital encounter of 10/31/18 (from the past 48 hour(s))  CBG monitoring, ED     Status: Abnormal   Collection Time: 10/31/18 11:08 AM  Result Value Ref Range   Glucose-Capillary 148 (H) 70 - 99 mg/dL  Protime-INR     Status: None   Collection Time: 10/31/18 11:10 AM  Result Value Ref Range   Prothrombin Time 13.4 11.4 - 15.2 seconds   INR 1.0 0.8 - 1.2    Comment: (NOTE) INR goal varies based on device and disease states. Performed at [facility], [address] [place], [place] [address]   APTT     Status: Abnormal   Collection Time: 10/31/18 11:10 AM  Result Value Ref Range   aPTT 22 (L) 24 - 36 seconds    Comment: Performed at [facility], [address] [place], [place] [address]  CBC     Status: None   Collection Time: 10/31/18 11:10 AM  Result Value Ref Range   WBC 9.4 4.0 -  10.5 K/uL   RBC 4.94 3.87 - 5.11 MIL/uL   Hemoglobin 13.8 12.0 - 15.0 g/dL   HCT 70.1 77.9 - 39.0 %   MCV 84.6 80.0 - 100.0 fL   MCH 27.9 26.0 - 34.0 pg   MCHC 33.0 30.0 - 36.0 g/dL   RDW 30.0 92.3 - 30.0 %   Platelets 257 150 - 400 K/uL   nRBC 0.0 0.0 - 0.2 %    Comment: Performed at [facility], [address], [place], [place] [address]  Differential     Status: Abnormal   Collection Time: 10/31/18 11:10 AM  Result Value Ref Range   Neutrophils Relative % 61 %   Neutro Abs 5.7 1.7 - 7.7 K/uL   Lymphocytes Relative 30 %   Lymphs Abs 2.8 0.7 - 4.0 K/uL   Monocytes Relative 6 %   Monocytes Absolute 0.5 0.1 - 1.0 K/uL   Eosinophils Relative 1 %   Eosinophils Absolute 0.1 0.0 - 0.5 K/uL   Basophils Relative 0 %   Basophils Absolute 0.0 0.0 - 0.1 K/uL   Immature Granulocytes 2 %   Abs Immature Granulocytes 0.15 (H) 0.00 - 0.07 K/uL    Comment: Performed at [facility], [address], [place],  [place] [id number]  Comprehensive metabolic panel     Status: Abnormal   Collection Time: 10/31/18 11:10 AM  Result Value Ref Range   Sodium 139 135 - 145 mmol/L   Potassium 3.9 3.5 - 5.1 mmol/L   Chloride 108 98 - 111 mmol/L   CO2 20 (L) 22 - 32 mmol/L   Glucose, Bld 165 (H) 70 - 99 mg/dL   BUN 19 6 - 20 mg/dL   Creatinine, Ser 6.040.92 0.44 - 1.00 mg/dL   Calcium 9.2 8.9 - 54.010.3 mg/dL   Total Protein 6.9 6.5 - 8.1 g/dL   Albumin 3.8 3.5 - 5.0 g/dL   AST 15 15 - 41 U/L   ALT 18 0 - 44 U/L   Alkaline Phosphatase 59 38 - 126 U/L   Total Bilirubin 0.3 0.3 - 1.2 mg/dL   GFR calc non Af Amer >60 >60 mL/min   GFR calc Af Amer >60 >60 mL/min   Anion gap 11 5 - 15    Comment: Performed at [facility], [address] [place], [place] [address]  I-stat chem 8, ED     Status: Abnormal   Collection Time: 10/31/18 11:19 AM  Result Value Ref Range   Sodium 141 135 - 145 mmol/L   Potassium 3.8 3.5 - 5.1 mmol/L   Chloride 107 98 - 111 mmol/L   BUN 21 (H) 6 - 20 mg/dL    Creatinine, Ser 1.470.80 0.44 - 1.00 mg/dL   Glucose, Bld 829160 (H) 70 - 99 mg/dL   Calcium, Ion 5.621.18 1.301.15 - 1.40 mmol/L   TCO2 19 (L) 22 - 32 mmol/L   Hemoglobin 14.6 12.0 - 15.0 g/dL   HCT 86.543.0 78.436.0 - 69.646.0 %  I-Stat beta hCG blood, ED     Status: None   Collection Time: 10/31/18 11:19 AM  Result Value Ref Range   I-stat hCG, quantitative <5.0 <5 mIU/mL   Comment 3            Comment:   GEST. AGE      CONC.  (mIU/mL)   <=1 WEEK        5 - 50     2 WEEKS       50 - 500     3 WEEKS       100 - 10,000     4 WEEKS     1,000 - 30,000        FEMALE AND NON-PREGNANT FEMALE:     LESS THAN 5 mIU/mL    Ct Angio Head W Or Wo Contrast  Result Date: 10/31/2018 CLINICAL DATA:  Intracranial hemorrhage.  Headache EXAM: CT ANGIOGRAPHY HEAD AND NECK TECHNIQUE: Multidetector CT imaging of the head and neck was performed using the standard protocol during bolus administration of intravenous contrast. Multiplanar CT image reconstructions and MIPs were obtained to evaluate the vascular anatomy. Carotid stenosis measurements (when applicable) are obtained utilizing NASCET criteria, using the distal internal carotid diameter as the denominator. CONTRAST:  75mL OMNIPAQUE IOHEXOL 350 MG/ML SOLN COMPARISON:  CT head 10/31/2018 FINDINGS: CTA NECK FINDINGS Aortic arch: Standard branching. Imaged portion shows no evidence of aneurysm or dissection. No significant stenosis of the major arch vessel origins. Right carotid system: Normal right carotid. Negative for stenosis or dissection. Left carotid system: Normal left carotid. Negative for stenosis or dissection Vertebral arteries: Both vertebral arteries are normal throughout their course and patent to the basilar. Skeleton: Negative Other neck: Goiter with enlargement of the thyroid. Heterogeneous  enhancement. No dominant nodule. Small nodules are present bilaterally. Upper chest: Mild apically emphysema.  No acute abnormality. Review of the MIP images confirms the above findings  CTA HEAD FINDINGS Anterior circulation: Cavernous carotid widely patent bilaterally without stenosis. Anterior and middle cerebral arteries patent bilaterally without significant stenosis. Posterior circulation: Both vertebral arteries patent to the basilar. PICA patent bilaterally. Basilar widely patent. Right AICA patent. Superior cerebellar and posterior cerebral arteries patent bilaterally without stenosis Abnormal vasculature in the left superior cerebellum associated with acute hematoma. Findings compatible with AVM. This is supplied by the left superior cerebellar artery with drainage into the straight sinus. The nidus measures approximately 10 mm in diameter Venous sinuses: Patent venous sinuses. Hypoplastic right transverse sinus Anatomic variants: None Review of the MIP images confirms the above findings IMPRESSION: 1. 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 Nidus approximately 1 cm. AVM supply primarily via the left superior cerebellar artery with drainage to the straight sinus. 2. No significant intracranial stenosis 3. No significant carotid or vertebral artery stenosis in the neck. 4. These results were called by telephone at the time of interpretation on 10/31/2018 at 11:30 am to provider [facility] , who verbally acknowledged these results. Electronically Signed   By: Marlan Palauharles  Clark M.D.   On: 10/31/2018 11:42   Ct Angio Neck W Or Wo Contrast  Result Date: 10/31/2018 CLINICAL DATA:  Intracranial hemorrhage.  Headache EXAM: CT ANGIOGRAPHY HEAD AND NECK TECHNIQUE: Multidetector CT imaging of the head and neck was performed using the standard protocol during bolus administration of intravenous contrast. Multiplanar CT image reconstructions and MIPs were obtained to evaluate the vascular anatomy. Carotid stenosis measurements (when applicable) are obtained utilizing NASCET criteria, using the distal internal carotid diameter as the denominator. CONTRAST:  75mL  OMNIPAQUE IOHEXOL 350 MG/ML SOLN COMPARISON:  CT head 10/31/2018 FINDINGS: CTA NECK FINDINGS Aortic arch: Standard branching. Imaged portion shows no evidence of aneurysm or dissection. No significant stenosis of the major arch vessel origins. Right carotid system: Normal right carotid. Negative for stenosis or dissection. Left carotid system: Normal left carotid. Negative for stenosis or dissection Vertebral arteries: Both vertebral arteries are normal throughout their course and patent to the basilar. Skeleton: Negative Other neck: Goiter with enlargement of the thyroid. Heterogeneous enhancement. No dominant nodule. Small nodules are present bilaterally. Upper chest: Mild apically emphysema.  No acute abnormality. Review of the MIP images confirms the above findings CTA HEAD FINDINGS Anterior circulation: Cavernous carotid widely patent bilaterally without stenosis. Anterior and middle cerebral arteries patent bilaterally without significant stenosis. Posterior circulation: Both vertebral arteries patent to the basilar. PICA patent bilaterally. Basilar widely patent. Right AICA patent. Superior cerebellar and posterior cerebral arteries patent bilaterally without stenosis Abnormal vasculature in the left superior cerebellum associated with acute hematoma. Findings compatible with AVM. This is supplied by the left superior cerebellar artery with drainage into the straight sinus. The nidus measures approximately 10 mm in diameter Venous sinuses: Patent venous sinuses. Hypoplastic right transverse sinus Anatomic variants: None Review of the MIP images confirms the above findings IMPRESSION: 1. 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 Nidus approximately 1 cm. AVM supply primarily via the left superior cerebellar artery with drainage to the straight sinus. 2. No significant intracranial stenosis 3. No significant carotid or vertebral artery stenosis in the neck. 4. These results  were called by telephone at the time of interpretation on 10/31/2018 at 11:30 am to provider ASHISH  ARORA , who verbally acknowledged these results. Electronically Signed   By: Franchot Gallo M.D.   On: 10/31/2018 11:42   Ct Head Code Stroke Wo Contrast  Result Date: 10/31/2018 CLINICAL DATA:  Code stroke.  Facial weakness.  Left-sided headache. EXAM: CT HEAD WITHOUT CONTRAST TECHNIQUE: Contiguous axial images were obtained from the base of the skull through the vertex without intravenous contrast. COMPARISON:  None. FINDINGS: Brain: Acute hemorrhage in the left superior cerebellum which is diffusely high density. Hematoma measures 1.7 x 1.4 x 2.0 cm. Hematoma volume 2.4 mL blood. No other acute hemorrhage. No evidence of underlying mass or edema Ventricle size normal.  No acute infarct. Vascular: Negative for hyperdense vessel Skull: Negative Sinuses/Orbits: Mild mucosal edema paranasal sinuses. Negative orbit. Other: None ASPECTS ([place] Stroke Program Early CT Score) - Ganglionic level infarction (caudate, lentiform nuclei, internal capsule, insula, M1-M3 cortex): 7 - Supraganglionic infarction (M4-M6 cortex): 3 Total score (0-10 with 10 being normal): 10 IMPRESSION: 1. Acute hemorrhage left superior cerebellum. Hematoma volume 2.4 mL. Location is unusual for hypertension. Rule out vascular malformation. CTA recommended. 2. ASPECTS is 10 3. These results were called by telephone at the time of interpretation on 10/31/2018 at 11:29 am to Dr. Rory Percy , who verbally acknowledged these results. Electronically Signed   By: Franchot Gallo M.D.   On: 10/31/2018 11:30    Pertinent items noted in HPI and remainder of comprehensive ROS otherwise negative.  Blood pressure (!) 123/57, pulse 66, temperature (!) 96.6 F (35.9 C), temperature source Temporal, resp. rate 15, height 5\' 4"  (1.626 m), weight 70.8 kg, SpO2 96 %.  Patient is awake and alert.  She is oriented and appropriate.  Her speech is fluent.  Her judgment  and insight are intact.  Cranial nerve function is normal bilaterally.  Motor examination 5/5 bilaterally.  No pronator drift.  Sensory examination nonfocal bilaterally.  Deep tender versus normal active.  No evidence of long track signs.  Gait not tested.  Examination head ears eyes nose throat is unremarked.  Chest and abdomen are benign.  Extremities are free from injury or deformity.  Neck is supple without tenderness.  Carotid pulses are normal bilaterally. 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  Blood clot itself is small in size with no evidence of significant mass-effect.  No secondary issues such as hydrocephalus or brainstem compression.  No evidence of intraventricular or significant subarachnoid hemorrhage.  Plan to admit to ICU for observation and blood pressure control.  Follow-up head CT scan in morning.  I discussed situation with Dr. Kathyrn Sheriff.  We are both in agreement no immediate intervention is necessary.-  Charlie Pitter 10/31/2018, 12:50 PM

## 2018-11-01 ENCOUNTER — Encounter (HOSPITAL_COMMUNITY): Payer: Self-pay | Admitting: *Deleted

## 2018-11-01 ENCOUNTER — Inpatient Hospital Stay (HOSPITAL_COMMUNITY): Payer: BLUE CROSS/BLUE SHIELD

## 2018-11-01 LAB — MRSA PCR SCREENING: MRSA by PCR: NEGATIVE

## 2018-11-01 LAB — HIV ANTIBODY (ROUTINE TESTING W REFLEX): HIV Screen 4th Generation wRfx: NONREACTIVE

## 2018-11-01 MED ORDER — ONDANSETRON HCL 4 MG/2ML IJ SOLN
4.0000 mg | Freq: Four times a day (QID) | INTRAMUSCULAR | Status: DC | PRN
  Administered 2018-11-01 – 2018-11-02 (×2): 4 mg via INTRAVENOUS
  Filled 2018-11-01: qty 2

## 2018-11-01 MED ORDER — CHLORHEXIDINE GLUCONATE CLOTH 2 % EX PADS
6.0000 | MEDICATED_PAD | Freq: Every day | CUTANEOUS | Status: DC
  Administered 2018-11-03: 6 via TOPICAL

## 2018-11-01 MED ORDER — PNEUMOCOCCAL VAC POLYVALENT 25 MCG/0.5ML IJ INJ
0.5000 mL | INJECTION | INTRAMUSCULAR | Status: AC
  Administered 2018-11-03: 13:00:00 0.5 mL via INTRAMUSCULAR
  Filled 2018-11-01 (×2): qty 0.5

## 2018-11-01 MED ORDER — PANTOPRAZOLE SODIUM 40 MG PO TBEC
40.0000 mg | DELAYED_RELEASE_TABLET | Freq: Every day | ORAL | Status: DC
  Administered 2018-11-01 – 2018-11-02 (×2): 40 mg via ORAL
  Filled 2018-11-01 (×2): qty 1

## 2018-11-01 MED ORDER — ONDANSETRON HCL 4 MG/2ML IJ SOLN
INTRAMUSCULAR | Status: AC
  Filled 2018-11-01: qty 2

## 2018-11-01 NOTE — Progress Notes (Signed)
NURSING PROGRESS NOTE  Jackie Anderson [id number] Transfer Data: 11/01/2018 3:31 PM Attending Provider: Earnie Larsson, MD TMA:[id number], No Pcp Per Code Status: FULL  Jackie Anderson is a 58 y.o. female patient transferred from 4N ICU -No acute distress noted.  -No complaints of shortness of breath.  -No complaints of chest pain.   Cardiac Monitoring: Box # [id number] in place. Cardiac monitor yields:normal sinus rhythm.  Blood pressure 122/65, pulse 68, temperature 98.3 F (36.8 C), temperature source Oral, resp. rate 16, height 5\' 4"  (1.626 m), weight 68.1 kg, SpO2 100 %.   IV Fluids:  IV in place, occlusive dsg intact without redness, IV cath antecubital right, condition patent and no redness, Right hand no redness noted normal saline.   Allergies:  Codeine and Latex  Past Medical History:   has no past medical history on file.  Past Surgical History:   has a past surgical history that includes Back surgery and Partial hysterectomy.  Social History:   reports that she has been smoking cigarettes. She has never used smokeless tobacco. She reports that she does not drink alcohol.  Skin: intact bottom red but blanching  Patient/Family orientated to room. Information packet given to patient/family. Admission inpatient armband information verified with patient/family to include name and date of birth and placed on patient arm. Side rails up x 2, fall assessment and education completed with patient/family. Patient/family able to verbalize understanding of risk associated with falls and verbalized understanding to call for assistance before getting out of bed. Call light within reach. Patient/family able to voice and demonstrate understanding of unit orientation instructions.    Will continue to evaluate and treat per MD orders.

## 2018-11-01 NOTE — Evaluation (Signed)
Physical Therapy Evaluation Patient Details Name: Jackie Anderson MRN: [id number] DOB: 09/14/1960 Today's Date: 11/01/2018   History of Present Illness  Pt is a 58 y.o. F with no significant PMH who presents with sudden onset of left sided headache followed by nausea, vomiting, left sided weakness. 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  Clinical Impression  Pt admitted with above. Pt presents with decreased functional mobility secondary to dynamic balance impairments. Pt ambulating 200 feet with no assistive device, mildly unsteady, decreased left step length. BP 123/74 post mobility. Currently recommending cane for ambulation and OPPT on d/c. Will continue to assess acutely.     Follow Up Recommendations Outpatient PT    Equipment Recommendations  Cane    Recommendations for Other Services       Precautions / Restrictions Precautions Precautions: Fall Restrictions Weight Bearing Restrictions: No      Mobility  Bed Mobility Overal bed mobility: Independent                Transfers Overall transfer level: Needs assistance Equipment used: None Transfers: Sit to/from Stand Sit to Stand: Supervision            Ambulation/Gait Ambulation/Gait assistance: Min guard Gait Distance (Feet): 200 Feet Assistive device: None;1 person hand held assist Gait Pattern/deviations: Step-through pattern;Decreased step length - left Gait velocity: decreased Gait velocity interpretation: <1.8 ft/sec, indicate of risk for recurrent falls General Gait Details: Pt mildly unsteady, decreased L step length, slower speed  Stairs            Wheelchair Mobility    Modified Rankin (Stroke Patients Only) Modified Rankin (Stroke Patients Only) Pre-Morbid Rankin Score: No symptoms Modified Rankin: Moderately severe disability     Balance Overall balance assessment: Needs assistance   Sitting balance-Leahy  Scale: Normal     Standing balance support: During functional activity;No upper extremity supported Standing balance-Leahy Scale: Fair   Single Leg Stance - Right Leg: 5 Single Leg Stance - Left Leg: 5     Rhomberg - Eyes Opened: 10                   Pertinent Vitals/Pain Pain Assessment: Faces Faces Pain Scale: Hurts a little bit Pain Location: left sided headache Pain Descriptors / Indicators: Headache Pain Intervention(s): Monitored during session    Home Living Family/patient expects to be discharged to:: Private residence Living Arrangements: Spouse/significant other Available Help at Discharge: Family;Available 24 hours/day Type of Home: House Home Access: Stairs to enter   [employer] Corporation[employer]trance Stairs-Number of Steps: 3 Home Layout: One level Home Equipment: Shower seat      Prior Function Level of Independence: Independent         Comments: Enjoys taking care of her grandchildren     Hand Dominance   Dominant Hand: Right    Extremity/Trunk Assessment   Upper Extremity Assessment Upper Extremity Assessment: RUE deficits/detail;LUE deficits/detail RUE Deficits / Details: Strength 5/5 RUE Sensation: WNL RUE Coordination: WNL LUE Deficits / Details: Strength 5/5 LUE Sensation: WNL LUE Coordination: WNL    Lower Extremity Assessment Lower Extremity Assessment: RLE deficits/detail;LLE deficits/detail RLE Deficits / Details: Strength 5/5 LLE Deficits / Details: Strength 5/5    Cervical / Trunk Assessment Cervical / Trunk Assessment: Normal  Communication   Communication: No difficulties  Cognition Arousal/Alertness: Awake/alert Behavior During Therapy: WFL for tasks assessed/performed Overall Cognitive Status: Within Functional Limits for tasks assessed  General Comments      Exercises     Assessment/Plan    PT Assessment Patient needs continued PT services  PT Problem List Decreased  balance;Decreased mobility       PT Treatment Interventions Gait training;Stair training;Functional mobility training;Therapeutic activities;DME instruction;Therapeutic exercise;Balance training;Patient/family education    PT Goals (Current goals can be found in the Care Plan section)  Acute Rehab PT Goals Patient Stated Goal: "take care of my grandchildren." PT Goal Formulation: With patient Time For Goal Achievement: 11/15/18 Potential to Achieve Goals: Good    Frequency Min 4X/week   Barriers to discharge        Co-evaluation               AM-PAC PT "6 Clicks" Mobility  Outcome Measure Help needed turning from your back to your side while in a flat bed without using bedrails?: None Help needed moving from lying on your back to sitting on the side of a flat bed without using bedrails?: None Help needed moving to and from a bed to a chair (including a wheelchair)?: None Help needed standing up from a chair using your arms (e.g., wheelchair or bedside chair)?: None Help needed to walk in hospital room?: A Little Help needed climbing 3-5 steps with a railing? : A Little 6 Click Score: 22    End of Session   Activity Tolerance: Patient tolerated treatment well Patient left: in bed;with call bell/phone within reach;Other (comment)(with OT) Nurse Communication: Mobility status PT Visit Diagnosis: Unsteadiness on feet (R26.81)    Time: 2707-8675 PT Time Calculation (min) (ACUTE ONLY): 22 min   Charges:   PT Evaluation $PT Eval Moderate Complexity: [place], [place], DPT Acute Rehabilitation Services Pager [phone] Office [phone]   Willy Eddy 11/01/2018, 10:53 AM

## 2018-11-01 NOTE — Progress Notes (Signed)
Overall doing well.  No issues or problems overnight.  Still with some mild headache.  No significant nausea vomiting.  No vertigo.  No further feelings of weakness or sensory dysfunction.  She is afebrile.  Her vital signs are stable.  Oxygenating well.  She is awake and alert.  She is oriented and appropriate.  Speech is fluent.  Judgment insight are intact.  Motor and sensory function of the extremities normal.  Follow-up head CT scan demonstrates stable appearance of her left paramedian superior cerebellar hematoma likely secondary to arteriovenous malformation.  No evidence of hydrocephalus no evidence of any enlargement or change in the clot itself.  Patient status post left cerebellar AVM hemorrhage.  Dr. Kathyrn Sheriff to discuss possibly doing arteriogram tomorrow.  Okay to transfer to floor.

## 2018-11-01 NOTE — Evaluation (Addendum)
Occupational Therapy Evaluation Patient Details Name: Jackie Anderson MRN: [id number] DOB: 19-Mar-1960 Today's Date: 11/01/2018    History of Present Illness Pt is a 58 y.o. F with no significant PMH who presents with sudden onset of left sided headache followed by nausea, vomiting, left sided weakness. 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    Clinical Impression   PT admitted with L para median cerebellar hematoma. Pt currently with functional limitiations due to the deficits listed below (see OT problem list). Pt currently supervision for transfers with unsteady balance. Pt reports HA and increased pain with head tilt yes/ no. Pt will benefit from skilled OT to increase their independence and safety with adls and balance to allow discharge home outpatient for balance.     Follow Up Recommendations  Other (comment)(outpatient balance)    Equipment Recommendations  None recommended by OT    Recommendations for Other Services       Precautions / Restrictions Precautions Precautions: Fall Restrictions Weight Bearing Restrictions: No      Mobility Bed Mobility Overal bed mobility: Independent                Transfers Overall transfer level: Needs assistance Equipment used: None Transfers: Sit to/from Stand Sit to Stand: Supervision              Balance Overall balance assessment: Needs assistance   Sitting balance-Leahy Scale: Normal     Standing balance support: During functional activity;No upper extremity supported Standing balance-Leahy Scale: Fair   Single Leg Stance - Right Leg: 5 Single Leg Stance - Left Leg: 5     Rhomberg - Eyes Opened: 10                 ADL either performed or assessed with clinical judgement   ADL Overall ADL's : Needs assistance/impaired Eating/Feeding: Modified independent   Grooming: Wash/dry  hands;Wash/dry face;Oral care;Supervision/safety;Standing Grooming Details (indicate cue type and reason): keeping head very static position Upper Body Bathing: Supervision/ safety   Lower Body Bathing: Supervison/ safety   Upper Body Dressing : Supervision/safety   Lower Body Dressing: Supervision/safety   Toilet Transfer: Supervision/safety       Tub/ Shower Transfer: Supervision/safety   Functional mobility during ADLs: Supervision/safety General ADL Comments: pt with head up or looking down with LOB. pt states 'That makes me dizzy" pt and OT talking about task taht would cause this same motion- kitchen cabinets, under the sink in the morning for hair spray, shopping in the grocery store to scan for a can . pt expresses understanding     Vision Baseline Vision/History: Wears glasses Wears Glasses: At all times       Perception     Praxis      Pertinent Vitals/Pain Pain Assessment: Faces Faces Pain Scale: Hurts little more Pain Location: L side of head Pain Descriptors / Indicators: Headache Pain Intervention(s): Monitored during session;Repositioned     Hand Dominance Right   Extremity/Trunk Assessment Upper Extremity Assessment Upper Extremity Assessment: Overall WFL for tasks assessed RUE Deficits / Details: Strength 5/5 RUE Sensation: WNL RUE Coordination: WNL LUE Deficits / Details: Strength 5/5 LUE Sensation: WNL LUE Coordination: WNL   Lower Extremity Assessment Lower Extremity Assessment: Defer to PT evaluation RLE Deficits / Details: Strength 5/5 LLE Deficits / Details: Strength 5/5   Cervical / Trunk Assessment Cervical / Trunk Assessment: Normal   Communication Communication  Communication: No difficulties   Cognition Arousal/Alertness: Awake/alert Behavior During Therapy: WFL for tasks assessed/performed Overall Cognitive Status: Within Functional Limits for tasks assessed                                     General  Comments  VSS    Exercises     Shoulder Instructions      Home Living Family/patient expects to be discharged to:: Private residence Living Arrangements: Spouse/significant other Available Help at Discharge: Family;Available 24 hours/day Type of Home: House Home Access: Stairs to enter Entergy CorporationEntrance Stairs-Number of Steps: 3   Home Layout: One level     Bathroom Shower/Tub: Producer, television/film/videoWalk-in shower   Bathroom Toilet: Standard     Home Equipment: Shower seat   Additional Comments: watches 6 yo grandchild and spouse is home 24/7 if needed      Prior Functioning/Environment Level of Independence: Independent        Comments: Enjoys taking care of her grandchildren        OT Problem List: Decreased activity tolerance;Impaired balance (sitting and/or standing);Decreased knowledge of use of DME or AE;Decreased knowledge of precautions      OT Treatment/Interventions: Self-care/ADL training;Therapeutic exercise;Neuromuscular education;Energy conservation;DME and/or AE instruction;Therapeutic activities;Balance training;Patient/family education    OT Goals(Current goals can be found in the care plan section) Acute Rehab OT Goals Patient Stated Goal: to be able to get back to normal OT Goal Formulation: With patient Time For Goal Achievement: 11/15/18 Potential to Achieve Goals: Good ADL Goals Pt Will Perform Tub/Shower Transfer: Shower transfer;with modified independence Additional ADL Goal #1: pt will complete DGI with score >19 indicating decrease fall risk with adls.  OT Frequency: Min 2X/week   Barriers to D/C:            Co-evaluation              AM-PAC OT "6 Clicks" Daily Activity     Outcome Measure Help from another person eating meals?: None Help from another person taking care of personal grooming?: None Help from another person toileting, which includes using toliet, bedpan, or urinal?: None Help from another person bathing (including washing, rinsing,  drying)?: A Little Help from another person to put on and taking off regular upper body clothing?: None Help from another person to put on and taking off regular lower body clothing?: None 6 Click Score: 23   End of Session Nurse Communication: Mobility status;Precautions  Activity Tolerance: Patient tolerated treatment well Patient left: in chair;with call bell/phone within reach  OT Visit Diagnosis: Unsteadiness on feet (R26.81)                Time: [id number] OT Time Calculation (min): 18 min Charges:  OT General Charges $OT Visit: 1 Visit OT Evaluation $OT Eval Moderate Complexity: 1 Mod   Mateo FlowBrynn Dault, OTR/L  Acute Rehabilitation Services Pager: [phone] Office: [phone] .   Mateo FlowBrynn Gonterman 11/01/2018, 11:29 AM

## 2018-11-02 ENCOUNTER — Inpatient Hospital Stay (HOSPITAL_COMMUNITY): Payer: BLUE CROSS/BLUE SHIELD

## 2018-11-02 ENCOUNTER — Encounter (HOSPITAL_COMMUNITY): Payer: Self-pay | Admitting: Neurosurgery

## 2018-11-02 HISTORY — PX: IR ANGIO INTRA EXTRACRAN SEL INTERNAL CAROTID BILAT MOD SED: IMG5363

## 2018-11-02 HISTORY — PX: IR ANGIO VERTEBRAL SEL VERTEBRAL BILAT MOD SED: IMG5369

## 2018-11-02 MED ORDER — MIDAZOLAM HCL 2 MG/2ML IJ SOLN
INTRAMUSCULAR | Status: AC
  Filled 2018-11-02: qty 2

## 2018-11-02 MED ORDER — LIDOCAINE HCL (PF) 1 % IJ SOLN
INTRAMUSCULAR | Status: AC | PRN
  Administered 2018-11-02: 10 mL

## 2018-11-02 MED ORDER — SODIUM CHLORIDE 0.9 % IV SOLN: INTRAVENOUS | Status: DC

## 2018-11-02 MED ORDER — FENTANYL CITRATE (PF) 100 MCG/2ML IJ SOLN
INTRAMUSCULAR | Status: AC | PRN
  Administered 2018-11-02: 25 ug via INTRAVENOUS

## 2018-11-02 MED ORDER — HEPARIN SODIUM (PORCINE) 1000 UNIT/ML IJ SOLN
INTRAMUSCULAR | Status: AC | PRN
  Administered 2018-11-02: 2000 [IU] via INTRAVENOUS

## 2018-11-02 MED ORDER — LIDOCAINE HCL 1 % IJ SOLN
INTRAMUSCULAR | Status: AC
  Filled 2018-11-02: qty 20

## 2018-11-02 MED ORDER — IOHEXOL 300 MG/ML  SOLN
100.0000 mL | Freq: Once | INTRAMUSCULAR | Status: AC | PRN
  Administered 2018-11-02: 36 mL via INTRA_ARTERIAL

## 2018-11-02 MED ORDER — HEPARIN SODIUM (PORCINE) 1000 UNIT/ML IJ SOLN
INTRAMUSCULAR | Status: AC
  Filled 2018-11-02: qty 1

## 2018-11-02 MED ORDER — FENTANYL CITRATE (PF) 100 MCG/2ML IJ SOLN
INTRAMUSCULAR | Status: AC
  Filled 2018-11-02: qty 2

## 2018-11-02 MED ORDER — MIDAZOLAM HCL 2 MG/2ML IJ SOLN
INTRAMUSCULAR | Status: AC | PRN
  Administered 2018-11-02: 1 mg via INTRAVENOUS

## 2018-11-02 NOTE — Progress Notes (Signed)
SLP Cancellation Note  Patient Details Name: Jackie Anderson MRN: [id number] DOB: February 22, 1961   Cancelled treatment:         Patient off floor for to procedure (diagnostic cerebral angiogram).    Charlynne Cousins Rosabelle Jupin, MA, CCC-SLP 11/02/2018 12:27 PM

## 2018-11-02 NOTE — Sedation Documentation (Signed)
Right femoral sheath removed, 20fr Exoseal closure device used at right groin site.

## 2018-11-02 NOTE — Progress Notes (Signed)
  NEUROSURGERY PROGRESS NOTE   No issues overnight. Has some HA and mild dizziness, overall improved from onset.  EXAM:  BP 129/76 (BP Location: Left Arm)   Pulse (!) 56   Temp 98.3 F (36.8 C) (Oral)   Resp 18   Ht 5\' 4"  (1.626 m)   Wt 68.1 kg   SpO2 99%   BMI 25.77 kg/m   Awake, alert, oriented  Speech fluent, appropriate  CN grossly intact  5/5 BUE/BLE   IMPRESSION:  58 y.o. female with left cerebellar hemorrhage likely related to AVM/fistula, neurologically intact  PLAN: - Will proceed with diagnostic cerebral angiogram today.  I have reviewed the situation with the patient and her husband. My recommendation for angiogram, the inherent risks, and alternatives were all reviewed. All their questions were answered and she provided consent to proceed.

## 2018-11-02 NOTE — Progress Notes (Signed)
Pt would like the doctor to explain procedure and answer questions before signing consent for angiogram.

## 2018-11-02 NOTE — Progress Notes (Signed)
PT Cancellation Note  Patient Details Name: Dot Splinter MRN: [id number] DOB: 27-Jun-1960   Cancelled Treatment:    Reason Eval/Treat Not Completed: Patient at procedure or test/unavailable. Will re-attempt later if time allows.    Dinora Hemm 11/02/2018, 12:58 PM

## 2018-11-02 NOTE — Progress Notes (Signed)
Overall stable.  Patient with mild headache.  No other neurologic symptoms.  Afebrile.  Vital signs are stable.  Awake and alert.  Oriented and appropriate.  Motor and sensory exam intact.  Cranial nerve function normal bilaterally.  Patient with left paramedial cerebellar vascular malformation.  Recommend monitoring performed arteriogram today to better evaluate lesion.

## 2018-11-02 NOTE — Brief Op Note (Signed)
  NEUROSURGERY BRIEF OPERATIVE  NOTE   PREOP DX: Cerebellar hemorrhage  POSTOP DX: Same  PROCEDURE: Diagnostic cerebral angiogram  SURGEON: Dr. Consuella Lose, MD  ANESTHESIA: IV Sedation with Local  EBL: Minimal  SPECIMENS: None  COMPLICATIONS: None  CONDITION: Stable to recovery  FINDINGS (Full report in PACS): 1. 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

## 2018-11-03 NOTE — Progress Notes (Signed)
  NEUROSURGERY PROGRESS NOTE   No issues overnight.  Mild HA, improved with tylenol No concerns this am Eager for d/c  EXAM:  BP (!) 129/55 (BP Location: Left Arm)   Pulse (!) 51   Temp 97.6 F (36.4 C)   Resp 18   Ht 5\' 4"  (1.626 m)   Wt 72 kg   SpO2 99%   BMI 27.25 kg/m   Awake, alert, oriented  Speech fluent, appropriate  CN grossly intact  5/5 BUE/BLE   IMPRESSION/PLAN 58 y.o. female with left cerebellar hemorrhage secondary to AVM. Doing well. - D/C home

## 2018-11-03 NOTE — Discharge Summary (Signed)
  Physician Discharge Summary  Patient ID: Jackie Anderson MRN: [id number] DOB/AGE: 1960-04-23 58 y.o.  Admit date: 10/31/2018 Discharge date: 11/03/2018  Admission Diagnoses:  [place]  Discharge Diagnoses:  Same Active Problems:   AVM (arteriovenous malformation) brain   ICH (intracerebral hemorrhage) [facility])  Discharged Condition: Stable  Hospital Course:  Jackie Anderson is a 57 y.o. female who presented to ED 10/31/2018 with acute sudden headache and feelings of left sided weakness. She underwent stat head CT and ultimately CTA which revealed a small left paramedian/medial superior cerebellar hemorrhage with associated, likely AVM. She was neurologically intact and did not require emergent NS intervention, but was admitted for observation and monitoring.   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 This will need to be addressed on an outpt basis. She otherwise had an uncomplicated course.  She remained neurologically intact. She worked with therapy and was ready for discharge. At time of discharge, pain was well controlled, ambulating with Pt/OT, tolerating po, voiding normal. Ready for discharge.  Treatments: Surgery Diagnostic cerebral angiogram  Discharge Exam: Blood pressure (!) 129/55, pulse (!) 51, temperature 97.6 F (36.4 C), resp. rate 18, height 5\' 4"  (1.626 m), weight 72 kg, SpO2 99 %. Awake, alert, oriented Speech fluent, appropriate CN grossly intact 5/5 BUE/BLE Wound c/d/i  Disposition: Discharge disposition: 01-Home or Self Care       Discharge Instructions    Call MD for:  difficulty breathing, headache or visual disturbances   Complete by: As directed    Call MD for:  persistant dizziness or light-headedness   Complete by: As directed    Call MD for:  redness, tenderness, or signs of infection (pain, swelling, redness, odor or green/yellow discharge around incision site)    Complete by: As directed    Call MD for:  severe uncontrolled pain   Complete by: As directed    Call MD for:  temperature >100.4   Complete by: As directed    Diet general   Complete by: As directed    Driving Restrictions   Complete by: As directed    Do not drive until given clearance.   Increase activity slowly   Complete by: As directed    Remove dressing in 24 hours   Complete by: As directed      Allergies as of 11/03/2018      Reactions   Codeine Rash   Latex Rash      Medication List    You have not been prescribed any medications.    Follow-up Information    Consuella Lose, MD Follow up.   Specialty: Neurosurgery Contact information: [address] [place] [address] [phone]           Signed: Traci Sermon 11/03/2018, 12:20 PM

## 2018-11-03 NOTE — Progress Notes (Signed)
Physical Therapy Treatment Patient Details Name: Jackie Anderson MRN: [id number] DOB: February 02, 1961 Today's Date: 11/03/2018    History of Present Illness Pt is a 58 y.o. F with no significant PMH who presents with sudden onset of left sided headache followed by nausea, vomiting, left sided weakness. 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    PT Comments    Patient received up in recliner. Agrees to PT session. Reports she hopes to go home later today. Performs sit to stand transfer independently. Ambulated 200 feet pushing her IV pole, then 100 feet without any UE support and supervision. No lob, however seemed a bit more hesitant when walking without UE support. Discussed patient using cane or walking stick on her right side especially when walking outside on uneven terrain. Patient verbalized understanding.  She will continue to benefit from skilled PT while here to work on higher level balance activities if she is not discharged home.      Follow Up Recommendations  Outpatient PT(for high level balance)     Equipment Recommendations  Cane    Recommendations for Other Services       Precautions / Restrictions Precautions Precautions: Fall Precaution Comments: mod fall Restrictions Weight Bearing Restrictions: No    Mobility  Bed Mobility               General bed mobility comments: patient in recliner  Transfers Overall transfer level: Independent Equipment used: None Transfers: Sit to/from Stand Sit to Stand: Independent            Ambulation/Gait Ambulation/Gait assistance: Supervision Gait Distance (Feet): 300 Feet(last 100 feet without any UE support) Assistive device: IV Pole;None Gait Pattern/deviations: Step-through pattern         Stairs             Wheelchair Mobility    Modified Rankin (Stroke Patients Only) Modified Rankin (Stroke Patients Only) Pre-Morbid Rankin  Score: No symptoms Modified Rankin: Slight disability     Balance Overall balance assessment: Independent   Sitting balance-Leahy Scale: Normal     Standing balance support: Single extremity supported Standing balance-Leahy Scale: Good                              Cognition Arousal/Alertness: Awake/alert Behavior During Therapy: WFL for tasks assessed/performed Overall Cognitive Status: Within Functional Limits for tasks assessed                                        Exercises      General Comments        Pertinent Vitals/Pain Pain Assessment: No/denies pain    Home Living Family/patient expects to be discharged to:: Private residence   Available Help at Discharge: Family;Available 24 hours/day Type of Home: House              Prior Function            PT Goals (current goals can now be found in the care plan section) Acute Rehab PT Goals Patient Stated Goal: to go back home today PT Goal Formulation: With patient/family Time For Goal Achievement: 11/15/18 Potential to Achieve Goals: Good Progress towards PT goals: Progressing toward goals    Frequency    Min 4X/week      PT Plan Current plan remains appropriate  Co-evaluation              AM-PAC PT "6 Clicks" Mobility   Outcome Measure  Help needed turning from your back to your side while in a flat bed without using bedrails?: None Help needed moving from lying on your back to sitting on the side of a flat bed without using bedrails?: None Help needed moving to and from a bed to a chair (including a wheelchair)?: None Help needed standing up from a chair using your arms (e.g., wheelchair or bedside chair)?: None Help needed to walk in hospital room?: A Little Help needed climbing 3-5 steps with a railing? : A Little 6 Click Score: 22    End of Session Equipment Utilized During Treatment: Gait belt Activity Tolerance: Patient tolerated treatment  well Patient left: in chair;with family/visitor present Nurse Communication: Mobility status PT Visit Diagnosis: Unsteadiness on feet (R26.81)     Time: [id number] PT Time Calculation (min) (ACUTE ONLY): 8 min  Charges:  $Gait Training: 8-22 mins                     Smith InternationalKristyn Monty Spicher, PT, GCS 11/03/18,12:21 PM

## 2018-11-03 NOTE — Evaluation (Signed)
Speech Language Pathology Evaluation Patient Details Name: Jackie Anderson MRN: [id number] DOB: 1960/11/25 Today's Date: 11/03/2018 Time: 1045-1100 SLP Time Calculation (min) (ACUTE ONLY): 15 min  Problem List:  Patient Active Problem List   Diagnosis Date Noted  . AVM (arteriovenous malformation) brain 10/31/2018  . ICH (intracerebral hemorrhage) ([place]) 10/31/2018   Past Medical History: History reviewed. No pertinent past medical history. Past Surgical History:  Past Surgical History:  Procedure Laterality Date  . BACK SURGERY    . IR ANGIO INTRA EXTRACRAN SEL INTERNAL CAROTID BILAT MOD SED  11/02/2018  . IR ANGIO VERTEBRAL SEL VERTEBRAL BILAT MOD SED  11/02/2018  . PARTIAL HYSTERECTOMY     HPI:  Pt is a 58 y.o. female with no significant PMH who presented with sudden onset of left sided headache followed by nausea, vomiting, left sided weakness. CT of the head showed  left superior cerebellar hematoma related to arteriovenous malformation.    Assessment / Plan / Recommendation Clinical Impression  Pt participated in speech/language evaluation with her husband present. Both parties denied any baseline deficits or acute changes in speech, language or cognition. Her speech and language skills are currently within normal limits and no overt cognitive deficits were noted. Further skilled SLP services are not clinically indicated at this time. Pt, nursing, and her husband were educated regarding results and recommendations; all parties verbalized understanding as well as agreement with plan of care.    SLP Assessment  SLP Recommendation/Assessment: Patient does not need any further Speech Lanaguage Pathology Services SLP Visit Diagnosis: Cognitive communication deficit (R41.841)    Follow Up Recommendations  None    Frequency and Duration           SLP Evaluation Cognition  Overall Cognitive Status: Within Functional Limits for tasks assessed Arousal/Alertness:  Awake/alert Orientation Level: Oriented X4 Attention: Focused;Sustained Focused Attention: Appears intact Sustained Attention: Appears intact Memory: Appears intact(Immediate: 3/3; delayed: 3/3) Awareness: Appears intact Problem Solving: Appears intact Executive Function: Reasoning Reasoning: Appears intact       Comprehension  Auditory Comprehension Overall Auditory Comprehension: Appears within functional limits for tasks assessed Yes/No Questions: Within Functional Limits Basic Biographical Questions: (5/5) Complex Questions: (4/5) Paragraph Comprehension (via yes/no questions): (3/4) Commands: Within Functional Limits Two Step Basic Commands: (4/4) Multistep Basic Commands: (4/4) Conversation: Complex Visual Recognition/Discrimination Discrimination: Within Function Limits Reading Comprehension Reading Status: Within funtional limits    Expression Expression Primary Mode of Expression: Verbal Verbal Expression Overall Verbal Expression: Appears within functional limits for tasks assessed Initiation: No impairment Automatic Speech: Counting;Day of week;Month of year(WNL) Level of Generative/Spontaneous Verbalization: Conversation Repetition: No impairment Naming: No impairment Responsive: (5/5) Confrontation: Within functional limits(10/10) Convergent: (5/5) Pragmatics: No impairment   Oral / Motor  Oral Motor/Sensory Function Overall Oral Motor/Sensory Function: Within functional limits Motor Speech Overall Motor Speech: Appears within functional limits for tasks assessed Respiration: Within functional limits Phonation: Normal Resonance: Within functional limits Articulation: Within functional limitis Intelligibility: Intelligible Motor Planning: Witnin functional limits Motor Speech Errors: Not applicable   Rettie Laird I. Hardin Negus, [place], [place] Office number [phone] Pager [place] 11/03/2018, 11:01 AM

## 2018-11-06 ENCOUNTER — Other Ambulatory Visit: Payer: Self-pay

## 2018-11-06 NOTE — Patient Outreach (Addendum)
[place] [facility]) Care Management  11/06/2018  Kelseigh Diver 09-13-1960 [id number]  EMMI: stroke red alert Referral date: 11/06/18 Referral reason: scheduled follow up: no Insurance: [employer] Day # 1  Outreach attempt #  Telephone call to patient regarding EMMI stroke red alert.  HIPAA verified with patient. RNCM explained reason for call. Patient states she contacted her primary MD office this morning and left message with the nurse. She states has not head back. RNCM advised patient if she does not hear back from the office today to call again on tomorrow for scheduling. Patient verbalized understanding. Patient states she has transportation to get to doctor appointments. Patient states she is not on any medications and reports she was not put on any medications at discharge from the hospital.  Patient states she does not take any medication on a normal basis.  Patient states she is only having a mild headache periodically. She denies any additional symptoms. Patient state she feels " a whole lot better overall."  RNCM discussed signs/ symptoms of stroke. Advised patient that 911 should be called for stroke like symptoms. Patient verbalized understanding.  She reports she did not need rehab therapy.  Patient verbally agreed to ongoing EMMI automated calls. Patient denied any further needs or concerns at this time.  RNCM advised patient to notify MD of any changes in condition prior to scheduled appointment. RNCM verified patient aware of 911 services for urgent/ emergent needs.  PLAN:  RNCM will close case due to patient being assessed and having no further needs.   Quinn Plowman RN,BSN,CCM [facility] Telephonic  [phone]

## 2018-11-14 ENCOUNTER — Other Ambulatory Visit: Payer: Self-pay

## 2018-11-14 NOTE — Patient Outreach (Signed)
Jackie Anderson [facility]) Care Management  11/14/2018  Jackie Anderson Dec 06, 1960 [id number]  EMMI: stroke red alert Referral date: 11/14/18 Referral reason: lost interest in things they used to do: yes,   Sad, hopeless, anxious, or empty: yes Insurance: [place] Day # 9  Outreach attempt #  Telephone call to patient regarding EMMI stroke red alert. HIPAA verified with patient. RNCM explained reason for call. Patient states she is doing fine other than an occasional mild headache. She states she answered No to the automated question.  Patient states she has been referred to an in plan doctor for her surgery.  She reports she will be having surgery for an atreriovenous malformation of the brain.   She states she has a consult visit with the doctor scheduled for 11/21/18.  Patient states she has transportation to her appointment.  Patient states overall she is doing very well and has no further complaints. Patient requested  EMMI stroke automated calls be discontinued.   RNCM advised patient to notify MD of any changes in condition prior to scheduled appointment. RNCM verified patient aware of 911 services for urgent/ emergent needs.  PLAN:  RNCM will close case due to patient being assessed and having no further needs.   Quinn Plowman RN,BSN,CCM [facility] Telephonic  [phone]

## 2021-04-26 IMAGING — XA IR ANGIO VETEBRAL SEL VERTEBRAL BILAT MOD SED
4 series · 13 of 24 positions shown · non-contrast
Comparison: none

PROCEDURE:
DIAGNOSTIC CEREBRAL ANGIOGRAM
HISTORY: The patient is a 58-year-old woman initially presented the hospital
with headache and dizziness. CT scan demonstrated a small left
cerebellar hemorrhage, with CT angiogram suggestive of arteriovenous
malformation. She therefore presents for further workup with
diagnostic cerebral angiogram.
TECHNIQUE: CATHETERS AND WIRES
5-French JB-1 catheter

[Series 7: cerebral care 2 · 2 acquisitions, 3 frames shown (1 of 2)]
[im 1/2]
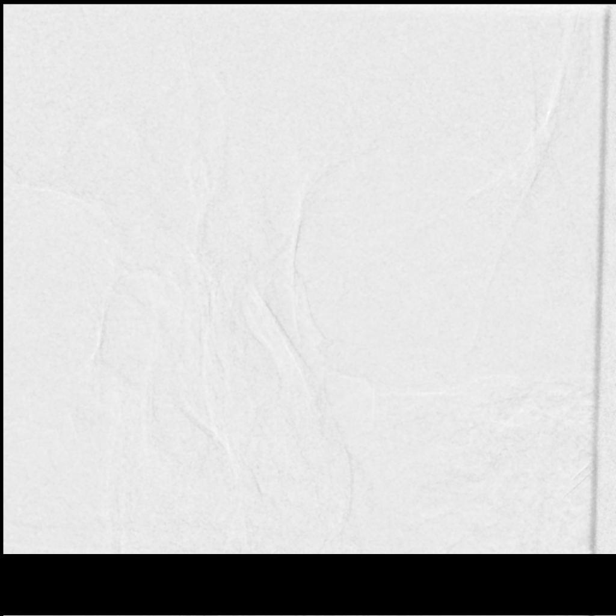
[im 1/2]
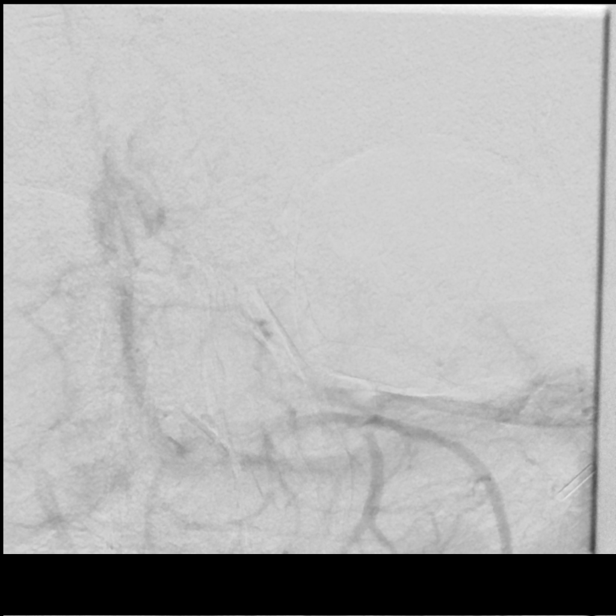
[im 2/2]
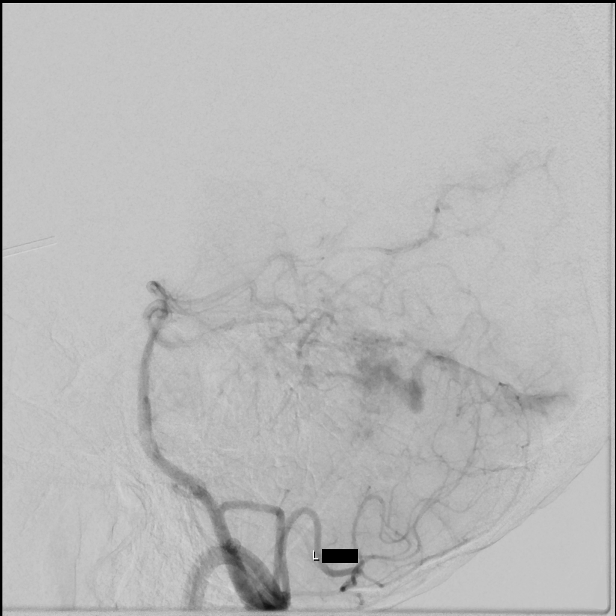

[Series 8: cerebral care 2 · 2 acquisitions, 3 frames shown (2 of 2)]
[im 1/2]
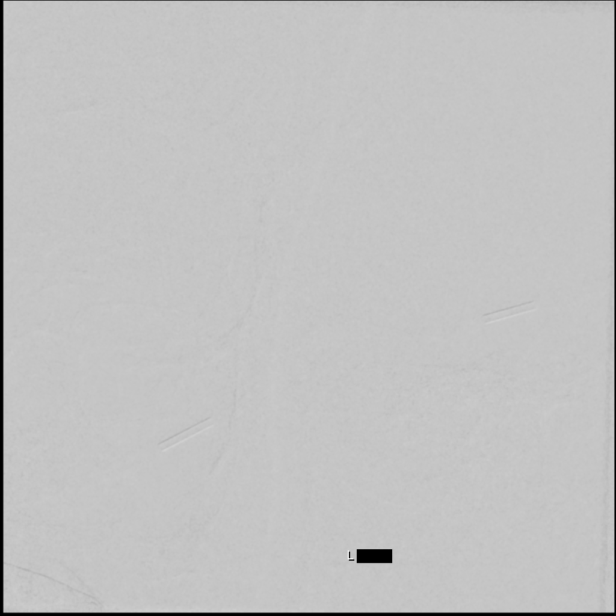
[im 1/2]
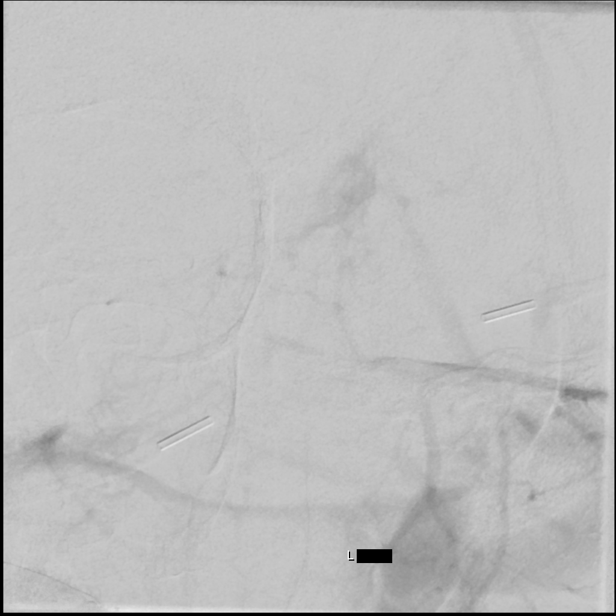
[im 2/2]
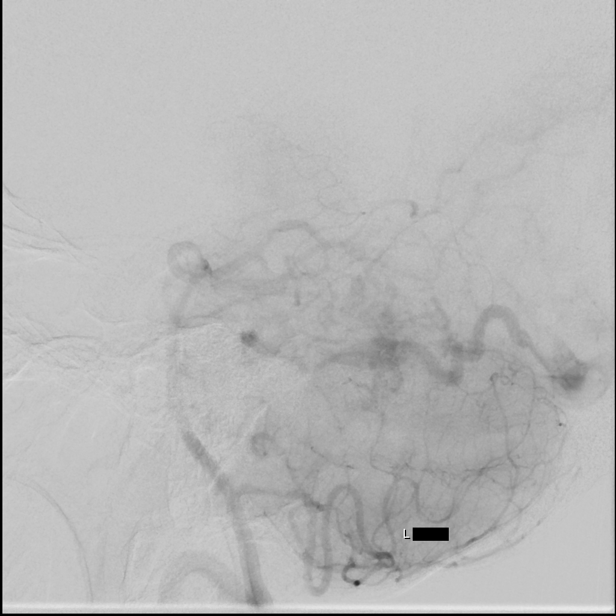

[Series 9: fl neuro n · 2 acquisitions, 4 frames shown]
[im 1/2]
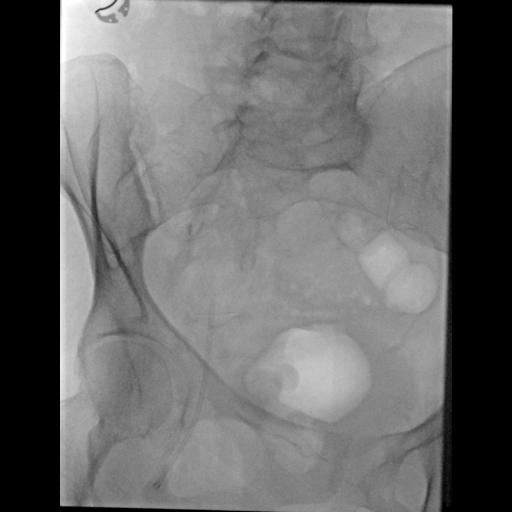
[im 1/2]
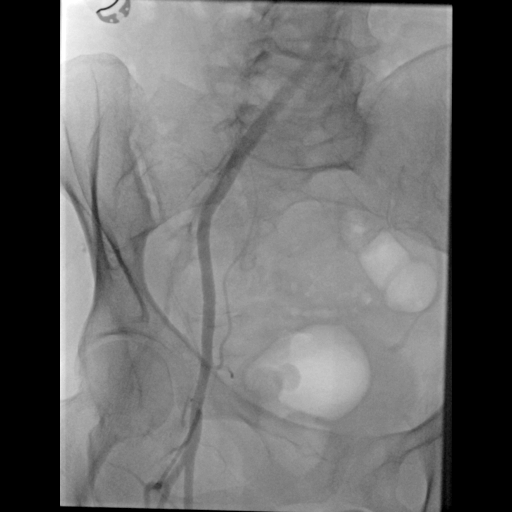
[im 2/2]
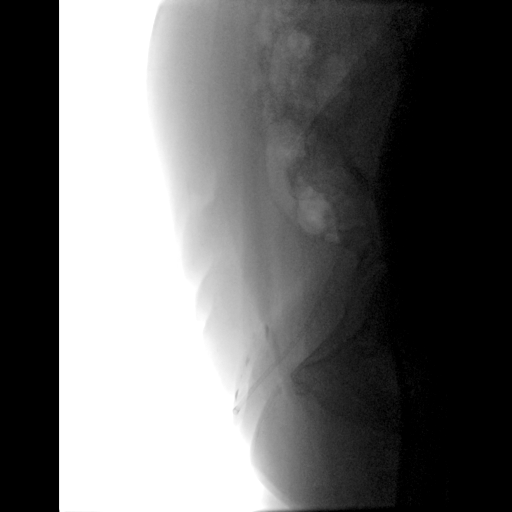
[im 2/2]
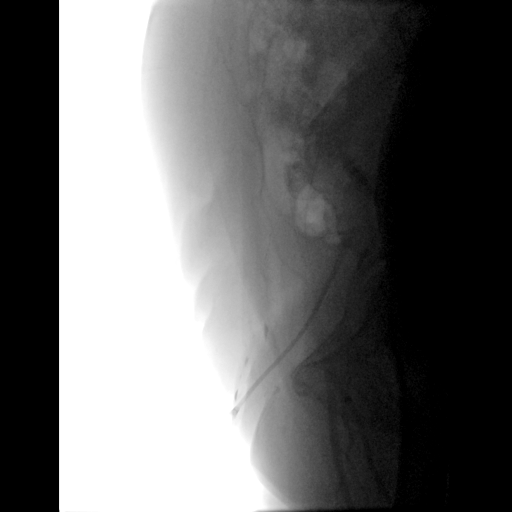

[Series 300: dr. (person_name) · 3 of 8 slices shown]
[im 3/8]
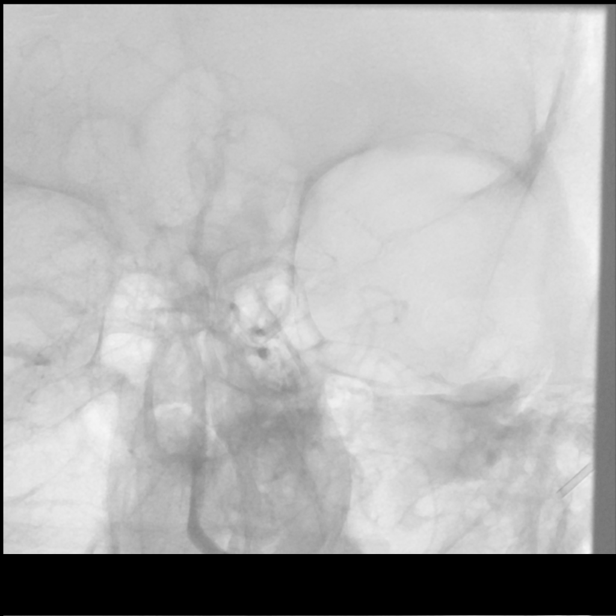
[im 5/8]
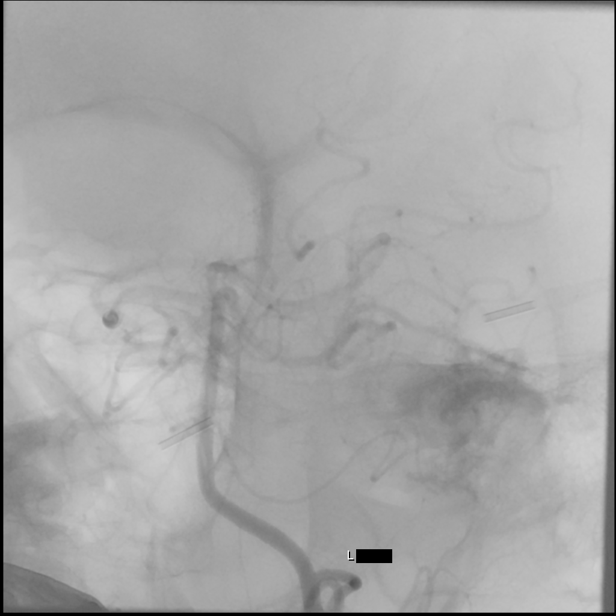
[im 8/8]
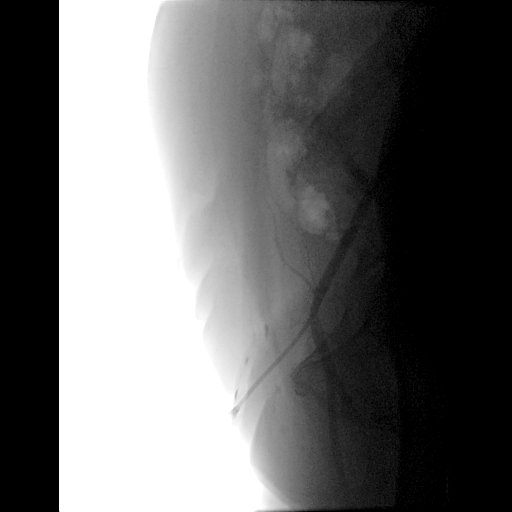

[13 of 24 positions shown; findings below may reference images not displayed]

ACCESS:
The technical aspects of the procedure as well as its potential
risks and benefits were reviewed with the patient. These risks
included but were not limited bleeding, infection, allergic
reaction, damage to organs or vital structures, stroke,
non-diagnostic procedure, and the catastrophic outcomes of heart
attack, coma, and death. With an understanding of these risks,
informed consent was obtained and witnessed. The patient was placed
in the supine position on the angiography table and the skin of
right groin prepped in the usual sterile fashion. The procedure was
performed under local anesthesia (1%-solution of
bicarbonate-buffered Lidocaine) and conscious sedation with Versed
and fentanyl monitored by the in-suite nurse. A 5- French sheath was
introduced in the right common femoral artery using Seldinger
technique. A fluoro-phase sequence was used to document the sheath
position.

MEDICATIONS:
HEPARIN: 1111 Units total.

CONTRAST:  36mL OMNIPAQUE IOHEXOL 300 MG/ML  SOLNcc, Omnipaque 300

FLUOROSCOPY TIME:  FLUOROSCOPY TIME: See IR records
0.035" glidewire

VESSELS CATHETERIZED
Right internal carotid

Left internal carotid

Left vertebral

Right vertebral

Right common femoral

VESSELS STUDIED
Right internal carotid, head

Left internal carotid, head

Left vertebral

Right vertebral

Right femoral

PROCEDURAL NARRATIVE
A 5-Fr JB-1 catheter was advanced over a 0.035 glidewire into the
aortic arch. The above vessels were then sequentially catheterized
and cervical / cerebral angiograms taken. After review of images,
the catheter was removed without incident.
FINDINGS: Right internal carotid: head:

Injection reveals the presence of a widely patent ICA, M1, and A1
segments and their branches. No aneurysms, AVMs, or high-flow
fistulas are seen. The parenchymal and venous phases are normal. The
venous sinuses are widely patent.

Left internal carotid: head:

Injection reveals the presence of a widely patent ICA, A1, and M1
segments and their branches. No aneurysms, AVMs, or high-flow
fistulas are seen. The parenchymal and venous phases are normal. The
venous sinuses are widely patent.

Left vertebral:

Injection reveals the presence of a widely patent vertebral artery.
This leads to a widely patent basilar artery that terminates in
bilateral P1. The basilar apex is normal. There is an arteriovenous
malformation on the tentorial surface of the left cerebellar
hemisphere measuring approximately 20 x 12 by 9 mm. Arteriovenous
malformation appears to be supplied primarily by the left superior
cerebellar artery. There are no flow related or intra-nidal
aneurysms. Venous outflow appears to be through a single draining
vein coursing posteriorly, emptying into the proximal left
transverse sinus. There is early opacification of the left
transverse sinus, sigmoid sinus, and jugular vein. There are no
venous varices identified. The venous sinuses are widely patent.

Right vertebral:

The vertebral artery is widely patent. No PICA aneurysm is seen. The
arteriovenous malformation in the left cerebellum is again
identified, better visualized through the left vertebral injection.

Right femoral:

Normal vessel. No significant atherosclerotic disease. Arterial
sheath in adequate position.

DISPOSITION:
Upon completion of the study, the femoral sheath was removed and
hemostasis obtained using a 5-Fr ExoSeal closure device. Good
proximal and distal lower extremity pulses were documented upon
achievement of hemostasis. The procedure was well tolerated and no
early complications were observed. The patient was transferred to
the holding area to lay flat for 2 hours.
IMPRESSION: 1.  Khondaker grade 1 left cerebellar AVM as described above.

The preliminary results of this procedure were shared with the
patient and the patient's family.
